# Patient Record
Sex: Male | Born: 1940 | Race: White | Hispanic: No | Smoking: Current every day smoker
Health system: Southern US, Community
[De-identification: ages and names within clinical notes are randomized; demographics above are authoritative.]

## PROBLEM LIST (undated history)

## (undated) DIAGNOSIS — R351 Nocturia: Secondary | ICD-10-CM

## (undated) DIAGNOSIS — I4891 Unspecified atrial fibrillation: Secondary | ICD-10-CM

## (undated) DIAGNOSIS — Z8719 Personal history of other diseases of the digestive system: Secondary | ICD-10-CM

## (undated) DIAGNOSIS — R319 Hematuria, unspecified: Secondary | ICD-10-CM

## (undated) DIAGNOSIS — G4733 Obstructive sleep apnea (adult) (pediatric): Secondary | ICD-10-CM

## (undated) DIAGNOSIS — Z8711 Personal history of peptic ulcer disease: Secondary | ICD-10-CM

## (undated) DIAGNOSIS — E785 Hyperlipidemia, unspecified: Secondary | ICD-10-CM

## (undated) DIAGNOSIS — M7120 Synovial cyst of popliteal space [Baker], unspecified knee: Secondary | ICD-10-CM

## (undated) DIAGNOSIS — R350 Frequency of micturition: Secondary | ICD-10-CM

## (undated) DIAGNOSIS — C679 Malignant neoplasm of bladder, unspecified: Secondary | ICD-10-CM

## (undated) DIAGNOSIS — E119 Type 2 diabetes mellitus without complications: Secondary | ICD-10-CM

## (undated) DIAGNOSIS — I10 Essential (primary) hypertension: Secondary | ICD-10-CM

## (undated) DIAGNOSIS — Z9289 Personal history of other medical treatment: Secondary | ICD-10-CM

## (undated) HISTORY — DX: Malignant neoplasm of bladder, unspecified: C67.9

## (undated) HISTORY — PX: COLONOSCOPY: SHX174

## (undated) HISTORY — DX: Essential (primary) hypertension: I10

## (undated) HISTORY — DX: Synovial cyst of popliteal space (Baker), unspecified knee: M71.20

## (undated) HISTORY — PX: REPAIR OF PERFORATED ULCER: SHX6065

## (undated) HISTORY — PX: UVULOPALATOPHARYNGOPLASTY: SHX827

## (undated) HISTORY — DX: Hyperlipidemia, unspecified: E78.5

## (undated) HISTORY — DX: Unspecified atrial fibrillation: I48.91

---
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
## 2000-03-08 ENCOUNTER — Encounter: Admission: RE | Admit: 2000-03-08 | Discharge: 2000-06-06 | Payer: Self-pay | Admitting: Endocrinology

## 2002-02-21 ENCOUNTER — Ambulatory Visit (HOSPITAL_COMMUNITY): Admission: RE | Admit: 2002-02-21 | Discharge: 2002-02-21 | Payer: Self-pay | Admitting: *Deleted

## 2004-01-19 ENCOUNTER — Encounter: Admission: RE | Admit: 2004-01-19 | Discharge: 2004-01-19 | Payer: Self-pay | Admitting: Endocrinology

## 2010-04-06 ENCOUNTER — Other Ambulatory Visit: Payer: Self-pay | Admitting: Dermatology

## 2010-05-09 ENCOUNTER — Other Ambulatory Visit: Payer: Self-pay | Admitting: Dermatology

## 2011-08-21 ENCOUNTER — Other Ambulatory Visit: Payer: Self-pay | Admitting: Endocrinology

## 2011-08-21 DIAGNOSIS — R911 Solitary pulmonary nodule: Secondary | ICD-10-CM

## 2011-08-24 ENCOUNTER — Ambulatory Visit
Admission: RE | Admit: 2011-08-24 | Discharge: 2011-08-24 | Disposition: A | Discharge status: Home / Self Care | Payer: BC Managed Care – PPO | Source: Ambulatory Visit | Attending: Endocrinology | Admitting: Endocrinology

## 2011-08-24 DIAGNOSIS — R911 Solitary pulmonary nodule: Secondary | ICD-10-CM

## 2011-12-28 ENCOUNTER — Encounter: Payer: Self-pay | Admitting: Gastroenterology

## 2012-01-25 ENCOUNTER — Other Ambulatory Visit (HOSPITAL_BASED_OUTPATIENT_CLINIC_OR_DEPARTMENT_OTHER): Payer: Self-pay | Admitting: Family Medicine

## 2012-01-25 ENCOUNTER — Ambulatory Visit (HOSPITAL_BASED_OUTPATIENT_CLINIC_OR_DEPARTMENT_OTHER)
Admission: RE | Admit: 2012-01-25 | Discharge: 2012-01-25 | Disposition: A | Discharge status: Home / Self Care | Payer: BC Managed Care – PPO | Source: Ambulatory Visit | Attending: Family Medicine | Admitting: Family Medicine

## 2012-01-25 DIAGNOSIS — M7989 Other specified soft tissue disorders: Secondary | ICD-10-CM | POA: Insufficient documentation

## 2012-01-25 DIAGNOSIS — R52 Pain, unspecified: Secondary | ICD-10-CM

## 2012-01-25 DIAGNOSIS — E119 Type 2 diabetes mellitus without complications: Secondary | ICD-10-CM | POA: Insufficient documentation

## 2012-01-25 DIAGNOSIS — Z87891 Personal history of nicotine dependence: Secondary | ICD-10-CM | POA: Insufficient documentation

## 2012-01-25 DIAGNOSIS — M79609 Pain in unspecified limb: Secondary | ICD-10-CM | POA: Insufficient documentation

## 2012-02-06 ENCOUNTER — Encounter: Payer: Self-pay | Admitting: Gastroenterology

## 2012-02-06 ENCOUNTER — Ambulatory Visit (AMBULATORY_SURGERY_CENTER): Payer: BC Managed Care – PPO | Admitting: *Deleted

## 2012-02-06 VITALS — Ht 72.0 in | Wt 253.0 lb

## 2012-02-06 DIAGNOSIS — Z1211 Encounter for screening for malignant neoplasm of colon: Secondary | ICD-10-CM

## 2012-02-06 MED ORDER — MOVIPREP 100 G PO SOLR: ORAL | Status: DC

## 2012-02-26 ENCOUNTER — Other Ambulatory Visit: Payer: BC Managed Care – PPO | Admitting: Gastroenterology

## 2012-02-28 ENCOUNTER — Telehealth: Payer: Self-pay | Admitting: Gastroenterology

## 2012-02-28 NOTE — Telephone Encounter (Signed)
Spoke with patient and informed him we would not cancel his colonoscopy appointment for 03/01/12, but to try to force plenty of liquids today as much as he possible and to avoid fiber, nuts and those foods on his list. He will  start his clear liquid diet tomorrow.He will call back if he has further questions.

## 2012-03-01 ENCOUNTER — Ambulatory Visit (AMBULATORY_SURGERY_CENTER): Payer: BC Managed Care – PPO | Admitting: Gastroenterology

## 2012-03-01 ENCOUNTER — Encounter: Payer: Self-pay | Admitting: Gastroenterology

## 2012-03-01 VITALS — BP 115/64 | HR 65 | Temp 98.1°F | Resp 19 | Ht 72.0 in | Wt 253.0 lb

## 2012-03-01 DIAGNOSIS — Z1211 Encounter for screening for malignant neoplasm of colon: Secondary | ICD-10-CM

## 2012-03-01 DIAGNOSIS — D126 Benign neoplasm of colon, unspecified: Secondary | ICD-10-CM

## 2012-03-01 DIAGNOSIS — K5730 Diverticulosis of large intestine without perforation or abscess without bleeding: Secondary | ICD-10-CM

## 2012-03-01 MED ORDER — SODIUM CHLORIDE 0.9 % IV SOLN: 500.0000 mL | INTRAVENOUS | Status: DC

## 2012-03-01 NOTE — Patient Instructions (Addendum)
YOU HAD AN ENDOSCOPIC PROCEDURE TODAY AT THE [facility]: Refer to the procedure report that was given to you for any specific questions about what was found during the examination.  If the procedure report does not answer your questions, please call your gastroenterologist to clarify.  If you requested that your care partner not be given the details of your procedure findings, then the procedure report has been included in a sealed envelope for you to review at your convenience later.  YOU SHOULD EXPECT: Some feelings of bloating in the abdomen. Passage of more gas than usual.  Walking can help get rid of the air that was put into your GI tract during the procedure and reduce the bloating. If you had a lower endoscopy (such as a colonoscopy or flexible sigmoidoscopy) you may notice spotting of blood in your stool or on the toilet paper. If you underwent a bowel prep for your procedure, then you may not have a normal bowel movement for a few days.  DIET: Your first meal following the procedure should be a light meal and then it is ok to progress to your normal diet.  A half-sandwich or bowl of soup is an example of a good first meal.  Heavy or fried foods are harder to digest and may make you feel nauseous or bloated.  Likewise meals heavy in dairy and vegetables can cause extra gas to form and this can also increase the bloating.  Drink plenty of fluids but you should avoid alcoholic beverages for 24 hours.  ACTIVITY: Your care partner should take you home directly after the procedure.  You should plan to take it easy, moving slowly for the rest of the day.  You can resume normal activity the day after the procedure however you should NOT DRIVE or use heavy machinery for 24 hours (because of the sedation medicines used during the test).    SYMPTOMS TO REPORT IMMEDIATELY: A gastroenterologist can be reached at any hour.  During normal business hours, 8:30 AM to 5:00 PM Monday through Friday,  call [phone].  After hours and on weekends, please call the GI answering service at [phone] who will take a message and have the physician on call contact you.   Following lower endoscopy (colonoscopy or flexible sigmoidoscopy):  Excessive amounts of blood in the stool  Significant tenderness or worsening of abdominal pains  Swelling of the abdomen that is new, acute  Fever of 100F or higher    FOLLOW UP: If any biopsies were taken you will be contacted by phone or by letter within the next 1-3 weeks.  Call your gastroenterologist if you have not heard about the biopsies in 3 weeks.  Our staff will call the home number listed on your records the next business day following your procedure to check on you and address any questions or concerns that you may have at that time regarding the information given to you following your procedure. This is a courtesy call and so if there is no answer at the home number and we have not heard from you through the emergency physician on call, we will assume that you have returned to your regular daily activities without incident.  SIGNATURES/CONFIDENTIALITY: You and/or your care partner have signed paperwork which will be entered into your electronic medical record.  These signatures attest to the fact that that the information above on your After Visit Summary has been reviewed and is understood.  Full responsibility of the confidentiality   of this discharge information lies with you and/or your care-partner.  Polyp, diverticulosis, and high fiber diet information given. 

## 2012-03-01 NOTE — Op Note (Signed)
[facility] [address] [place], [address]   COLONOSCOPY PROCEDURE REPORT  PATIENT: Steven Kline, Steven Kline  MR#: [id number] BIRTHDATE: [birth year] , 71  yrs. old GENDER: Male ENDOSCOPIST: Rachael Fee, MD REFERRED XB:[id number] Juleen China, M.D. PROCEDURE DATE:  03/01/2012 PROCEDURE:   Colonoscopy with biopsy ASA CLASS:   Class III INDICATIONS:average risk screening. MEDICATIONS: Fentanyl 100 mcg IV, Versed 4 mg IV, and These medications were titrated to patient response per physician's verbal order  DESCRIPTION OF PROCEDURE:   After the risks benefits and alternatives of the procedure were thoroughly explained, informed consent was obtained.  A digital rectal exam revealed no abnormalities of the rectum.   The LB CF-[id number]  endoscope was introduced through the anus and advanced to the cecum, which was identified by both the appendix and ileocecal valve. No adverse events experienced.   The quality of the prep was good, using MoviPrep  The instrument was then slowly withdrawn as the colon was fully examined.  COLON FINDINGS: One small polyp was found, removed and sent to pathology.  This was sessile, sigmoid colon, 2mm across, removed with biopsy forceps, sent to path (jar 1).  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  The examination was otherwise normal. Retroflexed views revealed no abnormalities. The time to cecum=8 minutes 15 seconds.  Withdrawal time=9 minutes 15 seconds.  The scope was withdrawn and the procedure completed. COMPLICATIONS: There were no complications.  ENDOSCOPIC IMPRESSION: One small polyp was found, removed and sent to pathology. 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 The examination was otherwise normal.  RECOMMENDATIONS: If the polyp(s) removed today are proven to be adenomatous (pre-cancerous) polyps, you will need a repeat colonoscopy in 5 years.   You will receive a letter within 1-2 weeks with the results of  your biopsy as well as final recommendations.  Please call my office if you have not received a letter after 3 weeks.   eSigned:  Rachael Fee, MD 03/01/2012 9:55 AM

## 2012-03-01 NOTE — Progress Notes (Signed)
Patient did not experience any of the following events: a burn prior to discharge; a fall within the facility; wrong site/side/patient/procedure/implant event; or a hospital transfer or hospital admission upon discharge from the facility. (G8907) Patient did not have preoperative order for IV antibiotic SSI prophylaxis. (G8918)  

## 2012-03-04 ENCOUNTER — Telehealth: Payer: Self-pay | Admitting: *Deleted

## 2012-03-04 NOTE — Telephone Encounter (Signed)
Left message that we called for f/u 

## 2012-03-07 ENCOUNTER — Encounter: Payer: Self-pay | Admitting: Gastroenterology

## 2015-10-03 ENCOUNTER — Emergency Department (HOSPITAL_COMMUNITY)
Admission: EM | Admit: 2015-10-03 | Discharge: 2015-10-03 | Disposition: A | Discharge status: Home / Self Care | Payer: 59 | Attending: Emergency Medicine | Admitting: Emergency Medicine

## 2015-10-03 ENCOUNTER — Encounter (HOSPITAL_COMMUNITY): Payer: Self-pay | Admitting: Emergency Medicine

## 2015-10-03 ENCOUNTER — Emergency Department (HOSPITAL_COMMUNITY): Payer: 59

## 2015-10-03 DIAGNOSIS — X58XXXA Exposure to other specified factors, initial encounter: Secondary | ICD-10-CM | POA: Insufficient documentation

## 2015-10-03 DIAGNOSIS — Y929 Unspecified place or not applicable: Secondary | ICD-10-CM | POA: Insufficient documentation

## 2015-10-03 DIAGNOSIS — Z7984 Long term (current) use of oral hypoglycemic drugs: Secondary | ICD-10-CM | POA: Insufficient documentation

## 2015-10-03 DIAGNOSIS — E119 Type 2 diabetes mellitus without complications: Secondary | ICD-10-CM | POA: Insufficient documentation

## 2015-10-03 DIAGNOSIS — I10 Essential (primary) hypertension: Secondary | ICD-10-CM | POA: Insufficient documentation

## 2015-10-03 DIAGNOSIS — Z79899 Other long term (current) drug therapy: Secondary | ICD-10-CM | POA: Diagnosis not present

## 2015-10-03 DIAGNOSIS — M9689 Other intraoperative and postprocedural complications and disorders of the musculoskeletal system: Secondary | ICD-10-CM | POA: Insufficient documentation

## 2015-10-03 DIAGNOSIS — E785 Hyperlipidemia, unspecified: Secondary | ICD-10-CM | POA: Insufficient documentation

## 2015-10-03 DIAGNOSIS — Y999 Unspecified external cause status: Secondary | ICD-10-CM | POA: Diagnosis not present

## 2015-10-03 DIAGNOSIS — S31109A Unspecified open wound of abdominal wall, unspecified quadrant without penetration into peritoneal cavity, initial encounter: Secondary | ICD-10-CM | POA: Diagnosis present

## 2015-10-03 DIAGNOSIS — Y939 Activity, unspecified: Secondary | ICD-10-CM | POA: Diagnosis not present

## 2015-10-03 DIAGNOSIS — F17210 Nicotine dependence, cigarettes, uncomplicated: Secondary | ICD-10-CM | POA: Insufficient documentation

## 2015-10-03 LAB — CBC WITH DIFFERENTIAL/PLATELET
Basophils Absolute: 0 10*3/uL (ref 0.0–0.1)
Basophils Relative: 0 %
EOS ABS: 0.3 10*3/uL (ref 0.0–0.7)
EOS PCT: 4 %
HCT: 32.3 % — ABNORMAL LOW (ref 39.0–52.0)
Hemoglobin: 9.7 g/dL — ABNORMAL LOW (ref 13.0–17.0)
LYMPHS ABS: 1.6 10*3/uL (ref 0.7–4.0)
Lymphocytes Relative: 17 %
MCH: 24.7 pg — AB (ref 26.0–34.0)
MCHC: 30 g/dL (ref 30.0–36.0)
MCV: 82.4 fL (ref 78.0–100.0)
MONO ABS: 0.8 10*3/uL (ref 0.1–1.0)
Monocytes Relative: 8 %
Neutro Abs: 6.8 10*3/uL (ref 1.7–7.7)
Neutrophils Relative %: 71 %
PLATELETS: 326 10*3/uL (ref 150–400)
RBC: 3.92 MIL/uL — AB (ref 4.22–5.81)
RDW: 18.5 % — AB (ref 11.5–15.5)
WBC: 9.4 10*3/uL (ref 4.0–10.5)

## 2015-10-03 LAB — BASIC METABOLIC PANEL
Anion gap: 6 (ref 5–15)
BUN: 17 mg/dL (ref 6–20)
CO2: 23 mmol/L (ref 22–32)
Calcium: 8.5 mg/dL — ABNORMAL LOW (ref 8.9–10.3)
Chloride: 109 mmol/L (ref 101–111)
Creatinine, Ser: 1.57 mg/dL — ABNORMAL HIGH (ref 0.61–1.24)
GFR calc Af Amer: 48 mL/min — ABNORMAL LOW (ref >60)
GFR calc non Af Amer: 41 mL/min — ABNORMAL LOW (ref >60)
Glucose, Bld: 108 mg/dL — ABNORMAL HIGH (ref 65–99)
Potassium: 4.8 mmol/L (ref 3.5–5.1)
Sodium: 138 mmol/L (ref 135–145)

## 2015-10-03 MED ORDER — SODIUM CHLORIDE 0.9 % IV BOLUS (SEPSIS)
1000.0000 mL | Freq: Once | INTRAVENOUS | Status: AC
  Administered 2015-10-03: 1000 mL via INTRAVENOUS

## 2015-10-03 MED ORDER — IOPAMIDOL (ISOVUE-300) INJECTION 61%
100.0000 mL | Freq: Once | INTRAVENOUS | Status: AC | PRN
  Administered 2015-10-03: 80 mL via INTRAVENOUS

## 2015-10-03 NOTE — ED Notes (Signed)
Applied a wet to dry dressing consisting of Xeroform and 4x4 dressings.

## 2015-10-03 NOTE — ED Provider Notes (Signed)
[facility] DEPT Provider Note   CSN: [id number] Arrival date & time: 10/03/15  [id number]  First Provider Contact:  First MD Initiated Contact with Patient 10/03/15 1939        History   Chief Complaint Chief Complaint  Patient presents with  . Post-op Problem    HPI Steven Kline is a 75 y.o. male presents with his daughter for evaluation of his surgical wound. On July 4 he had surgery at an outside hospital for a perforated gastric ulcer. Since then, patient has been put on antibiotics by his PCP for redness surrounding his upper abdominal wound. His daughter who is an ICU nurse removed his staples a few days ago. For several days since the surgery he has had serous sinus drainage out of a small hole at the bottom of his wound. However over the last couple days it has turned into a pustular drainage. No abdominal pain. No fevers or vomiting. Patient overall feels well except that his wound keeps draining. Is scheduled to follow-up with Dr. Barry Dienes and about 4 days.  HPI  Past Medical History:  Diagnosis Date  . Baker's cyst of knee   . Diabetes ([place])   . Gastric ulcer   . Hyperlipidemia   . Hypertension   . Sleep apnea    cpap    There are no active problems to display for this patient.   Past Surgical History:  Procedure Laterality Date  . [place]   left  . REPAIR OF PERFORATED ULCER    . uvulaplasty         Home Medications    Prior to Admission medications   Medication Sig Start Date End Date Taking? Authorizing Provider  glyBURIDE-metformin (GLUCOVANCE) 2.5-500 MG per tablet Take 2 tablets by mouth 2 (two) times daily with a meal. Takes 1 tablet in am and 2 tablets at bedtime   Yes Historical Provider, MD  lisinopril (PRINIVIL,ZESTRIL) 20 MG tablet Take 20 mg by mouth daily.    Yes Historical Provider, MD  ONGLYZA 5 MG TABS tablet Take 5 mg by mouth daily.    Yes Historical Provider, MD  pravastatin (PRAVACHOL) 80 MG tablet Take 80 mg by  mouth at bedtime.    Yes Historical Provider, MD  ranitidine (ZANTAC) 75 MG tablet Take 75 mg by mouth 2 (two) times daily.   Yes Historical Provider, MD  sulfamethoxazole-trimethoprim (BACTRIM DS,SEPTRA DS) 800-160 MG tablet Take 1 tablet by mouth 2 (two) times daily. 09/28/15 10/08/15 Yes Historical Provider, MD    Family History Family History  Problem Relation Age of Onset  . Colon cancer Neg Hx   . Stomach cancer Neg Hx     Social History Social History  Substance Use Topics  . Smoking status: Current Every Day Smoker    Types: Cigars  . Smokeless tobacco: Never Used     Comment: smokes 6 cigars a day  . Alcohol use No     Allergies   Review of patient's allergies indicates no known allergies.   Review of Systems Review of Systems  Constitutional: Negative for fever.  Gastrointestinal: Negative for abdominal pain and vomiting.  Skin: Positive for wound. Negative for color change.  All other systems reviewed and are negative.    Physical Exam Updated Vital Signs BP 131/67 (BP Location: Left Arm)   Pulse 64   Temp 98.5 F (36.9 C) (Oral)   Resp 18   Ht 6' (1.829 m)   Wt 220 lb (  99.8 kg)   SpO2 100%   BMI 29.84 kg/m   Physical Exam  Constitutional: He is oriented to person, place, and time. He appears well-developed and well-nourished.  HENT:  Head: Normocephalic and atraumatic.  Right Ear: External ear normal.  Left Ear: External ear normal.  Nose: Nose normal.  Eyes: Right eye exhibits no discharge. Left eye exhibits no discharge.  Neck: Neck supple.  Cardiovascular: Normal rate, regular rhythm, normal heart sounds and intact distal pulses.   Pulmonary/Chest: Effort normal and breath sounds normal.  Abdominal: Soft. He exhibits no distension. There is no tenderness.  Midline surgical wound. No warmth or erythema. Towards inferior aspect there is a small hole with no active drainage  Musculoskeletal: He exhibits no edema.  Neurological: He is alert and  oriented to person, place, and time.  Skin: Skin is warm and dry. No erythema.  Nursing note and vitals reviewed.    ED Treatments / Results  Labs (all labs ordered are listed, but only abnormal results are displayed) Labs Reviewed  BASIC METABOLIC PANEL - Abnormal; Notable for the following:       Result Value   Glucose, Bld 108 (*)    Creatinine, Ser 1.57 (*)    Calcium 8.5 (*)    GFR calc non Af Amer 41 (*)    GFR calc Af Amer 48 (*)    All other components within normal limits  CBC WITH DIFFERENTIAL/PLATELET - Abnormal; Notable for the following:    RBC 3.92 (*)    Hemoglobin 9.7 (*)    HCT 32.3 (*)    MCH 24.7 (*)    RDW 18.5 (*)    All other components within normal limits    EKG  EKG Interpretation None       Radiology Ct Abdomen Pelvis W Contrast  Result Date: 10/03/2015 CLINICAL DATA:  Patient with recent gastric surgery for perforated ulcer. Infected incision. EXAM: CT ABDOMEN AND PELVIS WITH CONTRAST TECHNIQUE: Multidetector CT imaging of the abdomen and pelvis was performed using the standard protocol following bolus administration of intravenous contrast. CONTRAST:  65mL ISOVUE-300 IOPAMIDOL (ISOVUE-300) INJECTION 61% COMPARISON:  None. FINDINGS: Lower chest: Normal heart size. Dependent atelectasis within the bilateral lower lobes. No pleural effusion. Hepatobiliary: Liver is normal in size and contour. No focal lesion identified. Gallbladder is unremarkable. Pancreas: Unremarkable Spleen: Unremarkable Adrenals/Urinary Tract: Normal adrenal glands. Kidneys enhance symmetrically with contrast. No hydronephrosis. Urinary bladder is unremarkable. Too small to characterize low-attenuation lesions within the right and left kidney. Stomach/Bowel: Sigmoid colonic diverticulosis. No CT evidence for acute diverticulitis. Normal appendix. Postsurgical changes along the greater curvature of the stomach compatible with recent surgical repair perforated gastric ulcer. There is  stranding within the surrounding fat. Additionally there is focal circumferential wall thickening of the adjacent transverse colon (image 35; series 2). There appears to be an identifiable fat plan located between the colon and the overlying midline surgical wound. Vascular/Lymphatic: Peripheral calcified atherosclerotic plaque. Normal caliber abdominal aorta. No retroperitoneal adenopathy. Other: Small fat containing right inguinal hernia.Central dystrophic calcifications in the prostate. Musculoskeletal: Ventral abdominal wall incision containing small amount of fluid and gas. There is surrounding fat stranding. No intra-abdominal extension or intra-abdominal abscess identified. IMPRESSION: Postsurgical changes along the greater curvature of the stomach compatible with surgical repair of a perforated gastric ulcer. There is a small amount of stranding within the surrounding fat. There is a ventral midline incision containing fluid and gas concerning for superimposed infection. There appears to be a definable  fat plane located between the ventral abdominal wall incision and the underlying bowel however clinical correlation is recommended to exclude the possibility of enteric cutaneous fistula. There is circumferential wall thickening of a portion of the distal transverse colon which may be reactive in etiology. This may be infectious or inflammatory or reactive from prior surgery. However given the appearance malignancy cannot be excluded at this location. If not previously performed, recommend correlation with colonoscopy in the non acute setting. Aortic atherosclerosis. Electronically Signed   By: Lovey Newcomer M.D.   On: 10/03/2015 21:48   Procedures Procedures (including critical care time)  Medications Ordered in ED Medications  sodium chloride 0.9 % bolus 1,000 mL (0 mLs Intravenous Stopped 10/03/15 2203)  iopamidol (ISOVUE-300) 61 % injection 100 mL (80 mLs Intravenous Contrast Given 10/03/15 2118)      Initial Impression / Assessment and Plan / ED Course  I have reviewed the triage vital signs and the nursing notes.  Pertinent labs & imaging results that were available during my care of the patient were reviewed by me and considered in my medical decision making (see chart for details).  Clinical Course  Comment By Time  Overall well appearing, denies pain. Will get CT to eval for possible intra-abdominal abscess given change in drainage color Sherwood Gambler, MD 07/23 1939  Discussed CT findings with Dr. Marlou Starks who is in [place]. Can't definitively say there is a fistula without oral contrast. Recommends no change in antibiotics (is on bactrim). He is overall well appearing, no cellulitis, no pain or fevers. WBC normal. Discussed his increased Cr with daughter and patient, increase fluids and f/u with PCP. Sherwood Gambler, MD 07/23 2329      Final Clinical Impressions(s) / ED Diagnoses   Final diagnoses:  Open abdominal wall wound, initial encounter    New Prescriptions Discharge Medication List as of 10/03/2015 11:28 PM       Sherwood Gambler, MD 10/04/15 [phone]

## 2015-10-03 NOTE — ED Notes (Signed)
Patient reports having abd surgery on 09/14/2015 in [place]. Patient with midline incision, at the most distal point of the incision there is approx a 26mm round open area with purulent drainage. Family at bedside states the color of the drainage has changed to this over the past couple days. Patient denies pain. Patient is A&Ox4, NAD noted. Patient has packet of paperwork from hospital, this was given to MD for review.

## 2015-10-03 NOTE — ED Triage Notes (Signed)
Patient reports having gastric surgery for a perforating stomach ulcer on July 4th. Now over the last 4-5 days, patient's incision is reddened and per family "pouring fluid when not covered."

## 2016-02-16 ENCOUNTER — Emergency Department (HOSPITAL_COMMUNITY): Payer: 59

## 2016-02-16 ENCOUNTER — Encounter (HOSPITAL_COMMUNITY): Payer: Self-pay | Admitting: Emergency Medicine

## 2016-02-16 ENCOUNTER — Emergency Department (HOSPITAL_COMMUNITY)
Admission: EM | Admit: 2016-02-16 | Discharge: 2016-02-16 | Disposition: A | Discharge status: Home / Self Care | Payer: 59 | Attending: Emergency Medicine | Admitting: Emergency Medicine

## 2016-02-16 DIAGNOSIS — R1010 Upper abdominal pain, unspecified: Secondary | ICD-10-CM | POA: Diagnosis not present

## 2016-02-16 DIAGNOSIS — Z79899 Other long term (current) drug therapy: Secondary | ICD-10-CM | POA: Insufficient documentation

## 2016-02-16 DIAGNOSIS — E119 Type 2 diabetes mellitus without complications: Secondary | ICD-10-CM | POA: Insufficient documentation

## 2016-02-16 DIAGNOSIS — F17290 Nicotine dependence, other tobacco product, uncomplicated: Secondary | ICD-10-CM | POA: Diagnosis not present

## 2016-02-16 DIAGNOSIS — Z7984 Long term (current) use of oral hypoglycemic drugs: Secondary | ICD-10-CM | POA: Diagnosis not present

## 2016-02-16 DIAGNOSIS — R1013 Epigastric pain: Secondary | ICD-10-CM | POA: Diagnosis present

## 2016-02-16 DIAGNOSIS — I10 Essential (primary) hypertension: Secondary | ICD-10-CM | POA: Diagnosis not present

## 2016-02-16 LAB — I-STAT CHEM 8, ED
BUN: 22 mg/dL — ABNORMAL HIGH (ref 6–20)
Calcium, Ion: 1.16 mmol/L (ref 1.15–1.40)
Chloride: 107 mmol/L (ref 101–111)
Creatinine, Ser: 1.1 mg/dL (ref 0.61–1.24)
Glucose, Bld: 115 mg/dL — ABNORMAL HIGH (ref 65–99)
HEMATOCRIT: 40 % (ref 39.0–52.0)
HEMOGLOBIN: 13.6 g/dL (ref 13.0–17.0)
Potassium: 4.4 mmol/L (ref 3.5–5.1)
SODIUM: 143 mmol/L (ref 135–145)
TCO2: 21 mmol/L (ref 0–100)

## 2016-02-16 MED ORDER — IOPAMIDOL (ISOVUE-300) INJECTION 61%
100.0000 mL | Freq: Once | INTRAVENOUS | Status: AC | PRN
  Administered 2016-02-16: 100 mL via INTRAVENOUS

## 2016-02-16 NOTE — ED Triage Notes (Signed)
Per pt, states he had abdominal surgery a few months ago-states 2 days ago he felt something pop in his incision-states he feels discomfort when he coughs-states he thinks he might have a hernia-incision healed and intact

## 2016-02-16 NOTE — ED Notes (Signed)
Bed: [facility]PT4 Expected date:  Expected time:  Means of arrival:  Comments: 

## 2016-02-16 NOTE — ED Provider Notes (Signed)
[facility] DEPT Provider Note   CSN: [id number] Arrival date & time: 02/16/16  1110     History   Chief Complaint Chief Complaint  Patient presents with  . Abdominal Pain    HPI SOMA ERICHSEN is a 75 y.o. male.  Patient states that he had surgery on his abdomen months ago. And he has noticed a bulging to his upper abdomen every once a while mild discomfort   The history is provided by the patient. No language interpreter was used.  Abdominal Pain   This is a new problem. The current episode started more than 2 days ago. The problem occurs rarely. The problem has been resolved. The pain is associated with an unknown factor. The pain is located in the epigastric region. The quality of the pain is aching. Pertinent negatives include diarrhea, frequency, hematuria and headaches.    Past Medical History:  Diagnosis Date  . Baker's cyst of knee   . Diabetes ([place])   . Gastric ulcer   . Hyperlipidemia   . Hypertension   . Sleep apnea    cpap    There are no active problems to display for this patient.   Past Surgical History:  Procedure Laterality Date  . COLON SURGERY    . [place]   left  . REPAIR OF PERFORATED ULCER    . uvulaplasty         Home Medications    Prior to Admission medications   Medication Sig Start Date End Date Taking? Authorizing Provider  glyBURIDE-metformin (GLUCOVANCE) 2.5-500 MG per tablet Take 2 tablets by mouth 2 (two) times daily with a meal. Takes 1 tablet in am and 2 tablets at bedtime    Historical Provider, MD  lisinopril (PRINIVIL,ZESTRIL) 20 MG tablet Take 20 mg by mouth daily.     Historical Provider, MD  ONGLYZA 5 MG TABS tablet Take 5 mg by mouth daily.     Historical Provider, MD  pravastatin (PRAVACHOL) 80 MG tablet Take 80 mg by mouth at bedtime.     Historical Provider, MD  ranitidine (ZANTAC) 75 MG tablet Take 75 mg by mouth 2 (two) times daily.    Historical Provider, MD    Family History Family  History  Problem Relation Age of Onset  . Colon cancer Neg Hx   . Stomach cancer Neg Hx     Social History Social History  Substance Use Topics  . Smoking status: Current Every Day Smoker    Types: Cigars  . Smokeless tobacco: Never Used     Comment: smokes 6 cigars a day  . Alcohol use No     Allergies   Patient has no known allergies.   Review of Systems Review of Systems  Constitutional: Negative for appetite change and fatigue.  HENT: Negative for congestion, ear discharge and sinus pressure.   Eyes: Negative for discharge.  Respiratory: Negative for cough.   Cardiovascular: Negative for chest pain.  Gastrointestinal: Positive for abdominal pain. Negative for diarrhea.  Genitourinary: Negative for frequency and hematuria.  Musculoskeletal: Negative for back pain.  Skin: Negative for rash.  Neurological: Negative for seizures and headaches.  Psychiatric/Behavioral: Negative for hallucinations.     Physical Exam Updated Vital Signs BP 153/73 (BP Location: Left Arm)   Pulse 83   Temp 97.6 F (36.4 C) (Oral)   Resp 16   Ht 6' (1.829 m)   Wt 235 lb (106.6 kg)   SpO2 100%   BMI 31.87  kg/m   Physical Exam  Constitutional: He is oriented to person, place, and time. He appears well-developed.  HENT:  Head: Normocephalic.  Eyes: Conjunctivae and EOM are normal. No scleral icterus.  Neck: Neck supple. No thyromegaly present.  Cardiovascular: Normal rate and regular rhythm.  Exam reveals no gallop and no friction rub.   No murmur heard. Pulmonary/Chest: No stridor. He has no wheezes. He has no rales. He exhibits no tenderness.  Abdominal: He exhibits no distension. There is no tenderness. There is no rebound.  He scored to abdomen  Musculoskeletal: Normal range of motion. He exhibits no edema.  Lymphadenopathy:    He has no cervical adenopathy.  Neurological: He is oriented to person, place, and time. He exhibits normal muscle tone. Coordination normal.  Skin:  No rash noted. No erythema.  Psychiatric: He has a normal mood and affect. His behavior is normal.     ED Treatments / Results  Labs (all labs ordered are listed, but only abnormal results are displayed) Labs Reviewed  I-STAT CHEM 8, ED - Abnormal; Notable for the following:       Result Value   BUN 22 (*)    Glucose, Bld 115 (*)    All other components within normal limits    EKG  EKG Interpretation None       Radiology Ct Abdomen Pelvis W Contrast  Result Date: 02/16/2016 CLINICAL DATA:  Abdominal surgery few months ago. Felt something pop. EXAM: CT ABDOMEN AND PELVIS WITH CONTRAST TECHNIQUE: Multidetector CT imaging of the abdomen and pelvis was performed using the standard protocol following bolus administration of intravenous contrast. CONTRAST:  172mL ISOVUE-300 IOPAMIDOL (ISOVUE-300) INJECTION 61% COMPARISON:  CT abdomen 10/03/2014, CT chest 08/24/2011, CT abdomen 01/19/2004 FINDINGS: Lower chest: 12 mm nodular area at the right lung base which may reflect area of scarring versus bronchiectasis versus a small nodule. Hepatobiliary: No focal liver abnormality is seen. No gallstones, gallbladder wall thickening, or biliary dilatation. Pancreas: Unremarkable. No pancreatic ductal dilatation or surrounding inflammatory changes. Spleen: Normal in size without focal abnormality. Adrenals/Urinary Tract: Adrenal glands are unremarkable. No urolithiasis or obstructive uropathy. 12 mm hypodense, fluid attenuating right renal mass most consistent with a cyst. Bladder is unremarkable. Stomach/Bowel: Stomach is within normal limits. Appendix appears normal. No evidence of bowel wall thickening, distention, or inflammatory changes. Vascular/Lymphatic: Abdominal aortic atherosclerosis. No lymphadenopathy. Reproductive: Prostate is unremarkable. Other: No abdominal wall hernia. Mild rectus diastases. No abdominopelvic ascites. Musculoskeletal: No acute osseous abnormality. No lytic or sclerotic  osseous lesion. Mild degenerative changes of bilateral sacroiliac joints. 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 IMPRESSION: 1. No acute abdominal or pelvic pathology. 2. 12 mm nodular area at the right lung base which may reflect area of scarring versus bronchiectasis versus a small nodule. Recommend follow-up CT chest in 3 months. 3. No abdominal wall hernia.  Mild rectus diastases. Electronically Signed   By: Kathreen Devoid   On: 02/16/2016 14:08    Procedures Procedures (including critical care time)  Medications Ordered in ED Medications  iopamidol (ISOVUE-300) 61 % injection 100 mL (100 mLs Intravenous Contrast Given 02/16/16 1338)     Initial Impression / Assessment and Plan / ED Course  I have reviewed the triage vital signs and the nursing notes.  Pertinent labs & imaging results that were available during my care of the patient were reviewed by me and considered in my medical  decision making (see chart for details).  Clinical Course     CT scan of abdomen unremarkable. Although a small nodule was seen in his lung. Suspect the bulging is abdomen is from muscle relaxation. Patient will be followed up with the surgeon and was told to have his primary care doctor repeat a CT of his chest in 3 months  Final Clinical Impressions(s) / ED Diagnoses   Final diagnoses:  Pain of upper abdomen    New Prescriptions New Prescriptions   No medications on file     Milton Ferguson, MD 02/16/16 1436

## 2016-02-16 NOTE — Discharge Instructions (Signed)
Follow-up with your general surgeon if you have any more problems with your abdomen. Your CAT scan showed a very small nodule on your lung. Most likely it is just scarring. The radiologist recommends a CAT scan of your chest in 3 months to reevaluate the small nodule

## 2016-04-05 DIAGNOSIS — E118 Type 2 diabetes mellitus with unspecified complications: Secondary | ICD-10-CM | POA: Diagnosis not present

## 2016-04-05 DIAGNOSIS — I10 Essential (primary) hypertension: Secondary | ICD-10-CM | POA: Diagnosis not present

## 2016-04-05 DIAGNOSIS — E789 Disorder of lipoprotein metabolism, unspecified: Secondary | ICD-10-CM | POA: Diagnosis not present

## 2016-04-12 DIAGNOSIS — E118 Type 2 diabetes mellitus with unspecified complications: Secondary | ICD-10-CM | POA: Diagnosis not present

## 2016-07-05 DIAGNOSIS — M6208 Separation of muscle (nontraumatic), other site: Secondary | ICD-10-CM | POA: Diagnosis not present

## 2016-08-11 DIAGNOSIS — E118 Type 2 diabetes mellitus with unspecified complications: Secondary | ICD-10-CM | POA: Diagnosis not present

## 2016-08-11 DIAGNOSIS — N390 Urinary tract infection, site not specified: Secondary | ICD-10-CM | POA: Diagnosis not present

## 2016-08-11 DIAGNOSIS — E789 Disorder of lipoprotein metabolism, unspecified: Secondary | ICD-10-CM | POA: Diagnosis not present

## 2016-08-15 DIAGNOSIS — R300 Dysuria: Secondary | ICD-10-CM | POA: Diagnosis not present

## 2016-08-15 DIAGNOSIS — N390 Urinary tract infection, site not specified: Secondary | ICD-10-CM | POA: Diagnosis not present

## 2016-08-21 DIAGNOSIS — D414 Neoplasm of uncertain behavior of bladder: Secondary | ICD-10-CM | POA: Diagnosis not present

## 2016-08-21 DIAGNOSIS — R310 Gross hematuria: Secondary | ICD-10-CM | POA: Diagnosis not present

## 2016-08-21 DIAGNOSIS — N400 Enlarged prostate without lower urinary tract symptoms: Secondary | ICD-10-CM | POA: Diagnosis not present

## 2016-08-30 DIAGNOSIS — R310 Gross hematuria: Secondary | ICD-10-CM | POA: Diagnosis not present

## 2016-09-05 DIAGNOSIS — N390 Urinary tract infection, site not specified: Secondary | ICD-10-CM | POA: Diagnosis not present

## 2016-09-05 DIAGNOSIS — E789 Disorder of lipoprotein metabolism, unspecified: Secondary | ICD-10-CM | POA: Diagnosis not present

## 2016-09-05 DIAGNOSIS — Z125 Encounter for screening for malignant neoplasm of prostate: Secondary | ICD-10-CM | POA: Diagnosis not present

## 2016-09-05 DIAGNOSIS — E118 Type 2 diabetes mellitus with unspecified complications: Secondary | ICD-10-CM | POA: Diagnosis not present

## 2016-09-05 DIAGNOSIS — R319 Hematuria, unspecified: Secondary | ICD-10-CM | POA: Diagnosis not present

## 2016-09-05 DIAGNOSIS — I10 Essential (primary) hypertension: Secondary | ICD-10-CM | POA: Diagnosis not present

## 2016-09-18 ENCOUNTER — Other Ambulatory Visit: Payer: Self-pay | Admitting: Urology

## 2016-09-18 DIAGNOSIS — D414 Neoplasm of uncertain behavior of bladder: Secondary | ICD-10-CM | POA: Diagnosis not present

## 2016-09-18 DIAGNOSIS — R310 Gross hematuria: Secondary | ICD-10-CM | POA: Diagnosis not present

## 2016-09-20 DIAGNOSIS — I10 Essential (primary) hypertension: Secondary | ICD-10-CM | POA: Diagnosis not present

## 2016-09-20 DIAGNOSIS — E669 Obesity, unspecified: Secondary | ICD-10-CM | POA: Diagnosis not present

## 2016-09-20 DIAGNOSIS — E119 Type 2 diabetes mellitus without complications: Secondary | ICD-10-CM | POA: Diagnosis not present

## 2016-09-20 DIAGNOSIS — D414 Neoplasm of uncertain behavior of bladder: Secondary | ICD-10-CM | POA: Diagnosis not present

## 2016-09-27 DIAGNOSIS — Z0189 Encounter for other specified special examinations: Secondary | ICD-10-CM | POA: Diagnosis not present

## 2016-09-27 DIAGNOSIS — I10 Essential (primary) hypertension: Secondary | ICD-10-CM | POA: Diagnosis not present

## 2016-09-27 DIAGNOSIS — Z01818 Encounter for other preprocedural examination: Secondary | ICD-10-CM | POA: Diagnosis not present

## 2016-09-27 DIAGNOSIS — R9431 Abnormal electrocardiogram [ECG] [EKG]: Secondary | ICD-10-CM | POA: Diagnosis not present

## 2016-09-29 DIAGNOSIS — R9431 Abnormal electrocardiogram [ECG] [EKG]: Secondary | ICD-10-CM | POA: Diagnosis not present

## 2016-10-26 ENCOUNTER — Encounter (HOSPITAL_BASED_OUTPATIENT_CLINIC_OR_DEPARTMENT_OTHER): Payer: Self-pay | Admitting: *Deleted

## 2016-10-26 NOTE — Progress Notes (Signed)
NPO AFTER MN.  ARRIVE AT 0600.  NEEDS ISTAT 8 AND EKG.

## 2016-11-01 DIAGNOSIS — R9431 Abnormal electrocardiogram [ECG] [EKG]: Secondary | ICD-10-CM | POA: Diagnosis not present

## 2016-11-05 NOTE — Anesthesia Preprocedure Evaluation (Addendum)
Anesthesia Evaluation  Patient identified by MRN, date of birth, ID band Patient awake    Reviewed: Allergy & Precautions, NPO status , Patient's Chart, lab work & pertinent test results  Airway Mallampati: III  TM Distance: >3 FB     Dental   Pulmonary sleep apnea , Current Smoker,    breath sounds clear to auscultation       Cardiovascular hypertension, Pt. on medications  Rhythm:Regular Rate:Normal  Daughters report pt had stress testing and echo with Dr. Einar Gip earlier this summer that were reportedly normal.    Neuro/Psych negative neurological ROS     GI/Hepatic negative GI ROS, Neg liver ROS,   Endo/Other  diabetes, Type 2, Oral Hypoglycemic Agents  Renal/GU negative Renal ROS     Musculoskeletal   Abdominal   Peds  Hematology negative hematology ROS (+)   Anesthesia Other Findings   Reproductive/Obstetrics                            Anesthesia Physical Anesthesia Plan  ASA: II  Anesthesia Plan: General   Post-op Pain Management:    Induction: Intravenous  PONV Risk Score and Plan: 2 and Ondansetron, Dexamethasone and Treatment may vary due to age or medical condition  Airway Management Planned: LMA  Additional Equipment:   Intra-op Plan:   Post-operative Plan: Extubation in OR  Informed Consent: I have reviewed the patients History and Physical, chart, labs and discussed the procedure including the risks, benefits and alternatives for the proposed anesthesia with the patient or authorized representative who has indicated his/her understanding and acceptance.   Dental advisory given  Plan Discussed with: CRNA  Anesthesia Plan Comments:        Anesthesia Quick Evaluation

## 2016-11-06 ENCOUNTER — Ambulatory Visit (HOSPITAL_BASED_OUTPATIENT_CLINIC_OR_DEPARTMENT_OTHER)
Admission: RE | Admit: 2016-11-06 | Discharge: 2016-11-06 | Disposition: A | Discharge status: Home / Self Care | Payer: Medicare Other | Source: Ambulatory Visit | Attending: Urology | Admitting: Urology

## 2016-11-06 ENCOUNTER — Ambulatory Visit (HOSPITAL_BASED_OUTPATIENT_CLINIC_OR_DEPARTMENT_OTHER): Payer: Medicare Other | Admitting: Anesthesiology

## 2016-11-06 ENCOUNTER — Encounter (HOSPITAL_BASED_OUTPATIENT_CLINIC_OR_DEPARTMENT_OTHER): Payer: Self-pay | Admitting: *Deleted

## 2016-11-06 ENCOUNTER — Encounter (HOSPITAL_BASED_OUTPATIENT_CLINIC_OR_DEPARTMENT_OTHER)
Admission: RE | Disposition: A | Discharge status: Home / Self Care | Payer: Self-pay | Source: Ambulatory Visit | Attending: Urology

## 2016-11-06 DIAGNOSIS — E119 Type 2 diabetes mellitus without complications: Secondary | ICD-10-CM | POA: Diagnosis not present

## 2016-11-06 DIAGNOSIS — Z7984 Long term (current) use of oral hypoglycemic drugs: Secondary | ICD-10-CM | POA: Insufficient documentation

## 2016-11-06 DIAGNOSIS — R310 Gross hematuria: Secondary | ICD-10-CM | POA: Insufficient documentation

## 2016-11-06 DIAGNOSIS — D494 Neoplasm of unspecified behavior of bladder: Secondary | ICD-10-CM

## 2016-11-06 DIAGNOSIS — N359 Urethral stricture, unspecified: Secondary | ICD-10-CM | POA: Insufficient documentation

## 2016-11-06 DIAGNOSIS — F17210 Nicotine dependence, cigarettes, uncomplicated: Secondary | ICD-10-CM | POA: Insufficient documentation

## 2016-11-06 DIAGNOSIS — I10 Essential (primary) hypertension: Secondary | ICD-10-CM | POA: Diagnosis not present

## 2016-11-06 DIAGNOSIS — C675 Malignant neoplasm of bladder neck: Secondary | ICD-10-CM | POA: Insufficient documentation

## 2016-11-06 DIAGNOSIS — C679 Malignant neoplasm of bladder, unspecified: Secondary | ICD-10-CM | POA: Diagnosis not present

## 2016-11-06 DIAGNOSIS — Z79899 Other long term (current) drug therapy: Secondary | ICD-10-CM | POA: Insufficient documentation

## 2016-11-06 HISTORY — DX: Personal history of other medical treatment: Z92.89

## 2016-11-06 HISTORY — PX: TRANSURETHRAL RESECTION OF BLADDER TUMOR: SHX2575

## 2016-11-06 HISTORY — DX: Personal history of peptic ulcer disease: Z87.11

## 2016-11-06 HISTORY — DX: Personal history of other diseases of the digestive system: Z87.19

## 2016-11-06 HISTORY — DX: Frequency of micturition: R35.0

## 2016-11-06 HISTORY — DX: Nocturia: R35.1

## 2016-11-06 HISTORY — DX: Type 2 diabetes mellitus without complications: E11.9

## 2016-11-06 HISTORY — DX: Hematuria, unspecified: R31.9

## 2016-11-06 HISTORY — DX: Obstructive sleep apnea (adult) (pediatric): G47.33

## 2016-11-06 LAB — POCT I-STAT, CHEM 8
BUN: 20 mg/dL (ref 6–20)
CALCIUM ION: 1.2 mmol/L (ref 1.15–1.40)
CREATININE: 1.2 mg/dL (ref 0.61–1.24)
Chloride: 108 mmol/L (ref 101–111)
GLUCOSE: 100 mg/dL — AB (ref 65–99)
HCT: 38 % — ABNORMAL LOW (ref 39.0–52.0)
HEMOGLOBIN: 12.9 g/dL — AB (ref 13.0–17.0)
POTASSIUM: 4 mmol/L (ref 3.5–5.1)
Sodium: 143 mmol/L (ref 135–145)
TCO2: 22 mmol/L (ref 22–32)

## 2016-11-06 LAB — GLUCOSE, CAPILLARY: Glucose-Capillary: 100 mg/dL — ABNORMAL HIGH (ref 65–99)

## 2016-11-06 SURGERY — TURBT (TRANSURETHRAL RESECTION OF BLADDER TUMOR)
Anesthesia: General | Site: Bladder

## 2016-11-06 MED ORDER — ONDANSETRON HCL 4 MG/2ML IJ SOLN
INTRAMUSCULAR | Status: AC
  Filled 2016-11-06: qty 2

## 2016-11-06 MED ORDER — SUGAMMADEX SODIUM 200 MG/2ML IV SOLN
INTRAVENOUS | Status: AC
  Filled 2016-11-06: qty 2

## 2016-11-06 MED ORDER — SODIUM CHLORIDE 0.9 % IR SOLN
Status: DC | PRN
  Administered 2016-11-06: 6000 mL

## 2016-11-06 MED ORDER — ROCURONIUM BROMIDE 50 MG/5ML IV SOSY
PREFILLED_SYRINGE | INTRAVENOUS | Status: DC | PRN
  Administered 2016-11-06: 25 mg via INTRAVENOUS

## 2016-11-06 MED ORDER — FENTANYL CITRATE (PF) 100 MCG/2ML IJ SOLN
25.0000 ug | INTRAMUSCULAR | Status: DC | PRN
  Filled 2016-11-06: qty 1

## 2016-11-06 MED ORDER — DEXAMETHASONE SODIUM PHOSPHATE 10 MG/ML IJ SOLN
INTRAMUSCULAR | Status: AC
  Filled 2016-11-06: qty 1

## 2016-11-06 MED ORDER — ARTIFICIAL TEARS OPHTHALMIC OINT
TOPICAL_OINTMENT | OPHTHALMIC | Status: AC
  Filled 2016-11-06: qty 3.5

## 2016-11-06 MED ORDER — SUCCINYLCHOLINE CHLORIDE 200 MG/10ML IV SOSY
PREFILLED_SYRINGE | INTRAVENOUS | Status: DC | PRN
  Administered 2016-11-06: 120 mg via INTRAVENOUS

## 2016-11-06 MED ORDER — DEXAMETHASONE SODIUM PHOSPHATE 4 MG/ML IJ SOLN
INTRAMUSCULAR | Status: DC | PRN
  Administered 2016-11-06: 10 mg via INTRAVENOUS

## 2016-11-06 MED ORDER — ONDANSETRON HCL 4 MG/2ML IJ SOLN
4.0000 mg | Freq: Once | INTRAMUSCULAR | Status: DC | PRN
  Filled 2016-11-06: qty 2

## 2016-11-06 MED ORDER — LACTATED RINGERS IV SOLN
INTRAVENOUS | Status: DC
  Administered 2016-11-06 (×2): via INTRAVENOUS
  Filled 2016-11-06: qty 1000

## 2016-11-06 MED ORDER — LIDOCAINE 2% (20 MG/ML) 5 ML SYRINGE
INTRAMUSCULAR | Status: AC
  Filled 2016-11-06: qty 5

## 2016-11-06 MED ORDER — CEPHALEXIN 500 MG PO CAPS: 500.0000 mg | ORAL_CAPSULE | Freq: Three times a day (TID) | ORAL | 0 refills | Status: DC

## 2016-11-06 MED ORDER — PROPOFOL 10 MG/ML IV BOLUS
INTRAVENOUS | Status: AC
  Filled 2016-11-06: qty 40

## 2016-11-06 MED ORDER — HYDROCODONE-ACETAMINOPHEN 5-325 MG PO TABS: 1.0000 | ORAL_TABLET | ORAL | 0 refills | Status: DC | PRN

## 2016-11-06 MED ORDER — FENTANYL CITRATE (PF) 100 MCG/2ML IJ SOLN
INTRAMUSCULAR | Status: AC
  Filled 2016-11-06: qty 2

## 2016-11-06 MED ORDER — CEFAZOLIN SODIUM-DEXTROSE 2-4 GM/100ML-% IV SOLN
INTRAVENOUS | Status: AC
  Filled 2016-11-06: qty 100

## 2016-11-06 MED ORDER — SUGAMMADEX SODIUM 200 MG/2ML IV SOLN
INTRAVENOUS | Status: DC | PRN
  Administered 2016-11-06: 200 mg via INTRAVENOUS

## 2016-11-06 MED ORDER — CEFAZOLIN SODIUM-DEXTROSE 2-4 GM/100ML-% IV SOLN
2.0000 g | INTRAVENOUS | Status: AC
  Administered 2016-11-06: 2 g via INTRAVENOUS
  Filled 2016-11-06: qty 100

## 2016-11-06 MED ORDER — EPHEDRINE SULFATE-NACL 50-0.9 MG/10ML-% IV SOSY
PREFILLED_SYRINGE | INTRAVENOUS | Status: DC | PRN
  Administered 2016-11-06: 5 mg via INTRAVENOUS
  Administered 2016-11-06: 15 mg via INTRAVENOUS

## 2016-11-06 MED ORDER — FENTANYL CITRATE (PF) 100 MCG/2ML IJ SOLN
INTRAMUSCULAR | Status: DC | PRN
  Administered 2016-11-06 (×2): 50 ug via INTRAVENOUS
  Administered 2016-11-06: 20 ug via INTRAVENOUS

## 2016-11-06 MED ORDER — EPHEDRINE 5 MG/ML INJ
INTRAVENOUS | Status: AC
  Filled 2016-11-06: qty 10

## 2016-11-06 MED ORDER — LIDOCAINE 2% (20 MG/ML) 5 ML SYRINGE
INTRAMUSCULAR | Status: DC | PRN
  Administered 2016-11-06: 20 mg via INTRAVENOUS
  Administered 2016-11-06: 100 mg via INTRAVENOUS

## 2016-11-06 MED ORDER — PROPOFOL 10 MG/ML IV BOLUS
INTRAVENOUS | Status: DC | PRN
  Administered 2016-11-06: 200 mg via INTRAVENOUS
  Administered 2016-11-06: 50 mg via INTRAVENOUS

## 2016-11-06 MED ORDER — ONDANSETRON HCL 4 MG/2ML IJ SOLN
INTRAMUSCULAR | Status: DC | PRN
  Administered 2016-11-06: 4 mg via INTRAVENOUS

## 2016-11-06 SURGICAL SUPPLY — 34 items
BAG DRAIN URO-CYSTO SKYTR STRL (DRAIN) ×3 IMPLANT
BAG DRN UROCATH (DRAIN) ×1
BAG URINE DRAINAGE (UROLOGICAL SUPPLIES) IMPLANT
BAG URINE LEG 500ML (DRAIN) ×2 IMPLANT
CATH FOLEY 2WAY SLVR  5CC 20FR (CATHETERS) ×2
CATH FOLEY 2WAY SLVR  5CC 22FR (CATHETERS)
CATH FOLEY 2WAY SLVR 30CC 20FR (CATHETERS) IMPLANT
CATH FOLEY 2WAY SLVR 5CC 20FR (CATHETERS) IMPLANT
CATH FOLEY 2WAY SLVR 5CC 22FR (CATHETERS) IMPLANT
CATH INTERMIT  6FR 70CM (CATHETERS) ×1 IMPLANT
CLOTH BEACON ORANGE TIMEOUT ST (SAFETY) ×3 IMPLANT
ELECT BIVAP BIPO 22/24 DONUT (ELECTROSURGICAL)
ELECT REM PT RETURN 9FT ADLT (ELECTROSURGICAL)
ELECTRD BIVAP BIPO 22/24 DONUT (ELECTROSURGICAL) IMPLANT
ELECTRODE REM PT RTRN 9FT ADLT (ELECTROSURGICAL) ×1 IMPLANT
EVACUATOR MICROVAS BLADDER (UROLOGICAL SUPPLIES) ×2 IMPLANT
GLOVE BIO SURGEON STRL SZ 6.5 (GLOVE) ×1 IMPLANT
GLOVE BIO SURGEON STRL SZ7.5 (GLOVE) ×3 IMPLANT
GLOVE BIO SURGEONS STRL SZ 6.5 (GLOVE) ×1
GLOVE BIOGEL PI IND STRL 6.5 (GLOVE) IMPLANT
GLOVE BIOGEL PI INDICATOR 6.5 (GLOVE) ×4
GOWN STRL REUS W/ TWL LRG LVL3 (GOWN DISPOSABLE) ×1 IMPLANT
GOWN STRL REUS W/ TWL XL LVL3 (GOWN DISPOSABLE) ×1 IMPLANT
GOWN STRL REUS W/TWL LRG LVL3 (GOWN DISPOSABLE) ×4 IMPLANT
GOWN STRL REUS W/TWL XL LVL3 (GOWN DISPOSABLE)
GUIDEWIRE STR DUAL SENSOR (WIRE) ×1 IMPLANT
KIT RM TURNOVER CYSTO AR (KITS) ×3 IMPLANT
LOOP CUT BIPOLAR 24F LRG (ELECTROSURGICAL) ×2 IMPLANT
MANIFOLD NEPTUNE II (INSTRUMENTS) ×3 IMPLANT
PACK CYSTO (CUSTOM PROCEDURE TRAY) ×3 IMPLANT
SET ASPIRATION TUBING (TUBING) IMPLANT
SYRINGE IRR TOOMEY STRL 70CC (SYRINGE) IMPLANT
TUBE CONNECTING 12'X1/4 (SUCTIONS)
TUBE CONNECTING 12X1/4 (SUCTIONS) IMPLANT

## 2016-11-06 NOTE — H&P (Signed)
CC: I have blood in my urine.  HPI: Steven Kline is a 76 year-old male established patient who is here for blood in the urine.  He did see the blood in his urine. He has seen blood clots.   He does not have a burning sensation when he urinates. He is not currently having trouble urinating.   He is not having pain.   Patient with episode of gross painless hematuria with small clots over 7 days. Urine culture was negative. That has since resolved. He still has residual microscopic hematuria on urinalysis today. No history of kidney stones or urinary tract infections. This has never happened before. He denies pain during these episodes. He has a long smoking history. He currently smokes.   CT urogram showed a 6 mm filling defect at the base of the bladder. 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 Has smoked since 08/11/1964. Smokes less than 1/2 pack per day.   Tobacco Use Assessment Completed: Used Tobacco in last 30 days? Drinks 4+ caffeinated drinks per day. Patient's occupation Hospital doctor.    REVIEW OF SYSTEMS:    GU Review Male:   Patient denies frequent urination, hard to postpone urination, burning/ pain with urination, get up at night to urinate, leakage of urine, stream starts and stops, trouble starting your stream, have to strain to urinate , erection problems, and penile pain.  Gastrointestinal (Upper):   Patient denies nausea, vomiting, and indigestion/ heartburn.  Gastrointestinal (Lower):   Patient denies diarrhea and constipation.   Constitutional:   Patient denies fever, night sweats, weight loss, and fatigue.  Skin:   Patient denies skin rash/ lesion and itching.  Eyes:   Patient denies blurred vision and double vision.  Ears/ Nose/ Throat:   Patient denies sore throat and sinus problems.  Hematologic/Lymphatic:   Patient denies swollen glands and easy bruising.  Cardiovascular:   Patient denies leg swelling and chest pains.  Respiratory:   Patient denies cough and shortness of breath.  Endocrine:   Patient denies excessive thirst.  Musculoskeletal:   Patient denies back pain and joint pain.  Neurological:   Patient denies headaches and dizziness.  Psychologic:   Patient denies depression and anxiety.   VITAL SIGNS:      09/18/2016 10:20 AM  BP 166/73 mmHg  Pulse 72 /min  Temperature 98.9 F / 37 C   MULTI-SYSTEM PHYSICAL EXAMINATION:    Constitutional: Well-nourished. No physical deformities. Normally developed. Good grooming.  Neck: Neck symmetrical, not swollen. Normal tracheal position.  Respiratory: No labored breathing, no use of accessory muscles.   Cardiovascular: Normal temperature, normal extremity pulses, no swelling, no varicosities.  Skin: No paleness, no jaundice, no cyanosis. No lesion, no ulcer, no rash.  Gastrointestinal: No mass, no tenderness, no rigidity, non obese abdomen.  Eyes: Normal conjunctivae. Normal eyelids.  Ears, Nose, Mouth, and Throat: Left ear no scars, no lesions, no masses. Right ear no scars, no lesions, no masses. Nose no scars, no lesions, no masses. Normal  hearing. Normal lips.  Musculoskeletal: Normal gait and station of head and neck.     PAST DATA REVIEWED:  Source Of History:  Patient  X-Ray Review: C.T. Hematuria: Reviewed Films. Reviewed Report. Discussed With Patient.     PROCEDURES:         Flexible Cystoscopy - 52000  Risks, benefits, and some of the potential complications of the procedure were discussed at length with the patient including infection,  bleeding, voiding discomfort, urinary retention, fever, chills, sepsis, and others. All questions were answered. Informed consent was obtained. Antibiotic prophylaxis was given. Sterile technique and intraurethral analgesia were used.  Meatus:  Normal size. Normal location. Normal condition.  Urethra:  No strictures.  External Sphincter:  Normal.  Verumontanum:  Normal.  Prostate:  Obstructing. Enlarged median lobe. Moderate hyperplasia.  Bladder Neck:  Non-obstructing.  Ureteral Orifices:  Normal location. Normal size. Normal shape. Effluxed clear urine.  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 This is consistent with what was seen on the CT scan.      The lower urinary tract was carefully examined. The procedure was well-tolerated and without complications. Antibiotic instructions were given. Instructions were given to call the office immediately for bloody urine, difficulty urinating, urinary retention, painful or frequent urination, fever, chills, nausea, vomiting or other illness. The patient stated that he understood these instructions and would comply with them.         Urinalysis w/Scope Dipstick Dipstick Cont'd Micro  Color: Yellow Bilirubin: Neg WBC/hpf: 0 - 5/hpf  Appearance: Clear Ketones: Neg RBC/hpf: 10 - 20/hpf  Specific Gravity: 1.025 Blood: 2+ Bacteria: NS (Not Seen)  pH: 6.0 Protein: Trace Cystals: NS (Not Seen)  Glucose: Neg Urobilinogen: 0.2 Casts: NS (Not Seen)    Nitrites: Neg Trichomonas: Not Present    Leukocyte Esterase: Neg Mucous: Not Present      Epithelial Cells: NS (Not Seen)      Yeast: NS (Not Seen)      Sperm: Not Present    ASSESSMENT:      ICD-10 Details  1 GU:   Gross hematuria - R31.0   2   Bladder tumor/neoplasm - D41.4    PLAN:           Orders Labs Urine Culture          Document Letter(s):  Created for W Gareth Eagle, MD   Created for Patient: Clinical Summary     The risks,  benefits, and some of the possible complications of transurethral resection of the bladder tumor were discussed with the patient including the anticipated cure rate, the possible need for additional surgical procedures, adjuvant chemotherapy, radiation therapy or immunotherapy. The possibility that this operative procedure might not to cure the underlying disease, that micrometastatic disease might already be present, and that this underlying disease might result in the death of the patient was discussed.  The general risks of the operative procedure and the perioperative period were discussed with the patient at length and in detail including swelling, pain, nausea, vomiting, fever, chills, infection, wound infection, sepsis, renal failure, internal or external bleeding, intraoperative bowel, organ or vascular injuries, postoperative formation of scar tissue, the need for blood transfusions, deep venous thrombosis or blood clots, pulmonary embolus, pneumonia, respiratory failure, heart attack, stroke, death and others.   Aternative treatment options were discussed with the patient in detail. All of the patient's questions were answered and he voiced an understanding of these  risks, benefits and possible complications.   The patient gave fully informed consent to proceed with a transurethral resection of their bladder tumor for the treatment of thier bladder cancer.        Notes:   1. Bladder lesion  I discussed with patient and his daughter that the lesion is concerning for TCC. He will undergo TURBT.   2. Gross hematuria  as above   3. Lung nodule  Patient was given a copy of his CT report that showed a lung nodule for which annual surveillance was recommended to 5 years. I discussed this with the patient and gave him a copy to his report. He will discuss surveillance imaging with his primary care provider at his next visit.

## 2016-11-06 NOTE — Op Note (Signed)
Date of procedure: 11/06/16  Preoperative diagnosis:  1. Bladder tumor   Postoperative diagnosis:  1. Bladder tumor 3  2. Meatal stenosis  Procedure: 1. Transurethral resection of bladder tumor 3 (largest 2 cm) 2. Meatal dilation  Surgeon: Baruch Gouty, MD  Anesthesia: General  Complications: None  Intraoperative findings: the patient had 3 separate bladder tumors One was on the median lobe of his prostatic urethra, one was medial to the left ureteral orifice. One was lateral to the left ureteral orifice. The largest was the one medial to the left ureteral orifice which was 2 years. These were resected down to muscle.  EBL: none  Specimens: bladder tumor to pathology  Drains: 20 French Foley catheter  Disposition: Stable to the postanesthesia care unit  Indication for procedure: The patient is a 76 y.o. male with history of gross hematuria where workup revealed a bladder tumor on the floor of his bladder.  He presents today for definitive surgical management.After reviewing the management options for treatment, the patient elected to proceed with the above surgical procedure(s). We have discussed the potential benefits and risks of the procedure, side effects of the proposed treatment, the likelihood of the patient achieving the goals of the procedure, and any potential problems that might occur during the procedure or recuperation. Informed consent has been obtained.  Description of procedure: The patient was met in the preoperative area. All risks, benefits, and indications of the procedure were described in great detail. The patient consented to the procedure. Preoperative antibiotics were given. The patient was taken to the operative theater. General anesthesia was induced per the anesthesia service. The patient was then placed in the dorsal lithotomy position and prepped and draped in the usual sterile fashion. A preoperative timeout was called.   An attempt was made to  place a 24 [place] resectoscope sheath with visual obturator. However, the patient had meatal stenosis. Using male urethral sounds and the anus was dilated to 28 [place] to accommodate the resectoscope. The scope was then placed with the visual obturator under direct visualization atraumatically. The patient was noted to have a 2 cm tumor on the median lobe of his prostate urethra. He also had a tumor approximately 2 cm in size medial to his left ureteral orifice. Another approximately 2 cm tumor lateral to the left ureteral orifice. All 3 tumors were then resected down to the muscle layers. The end of the procedure both ureteral orifices were intact. Hemostasis was obtained which was excellent. An muscle was seen in the specimen beds.The bladder chips were then evacuated with Ellik evacuator. Specimens were sent to pathology. Due to the resection of his prostatic urethra and his meatal stenosis, decision was made to leave a Foley catheter overnight to aid with hemostasis. A 20 French Foley catheter was placed. The patient was ANESTHESIA and transferred in stable condition to the postanesthesia care unit.  Plan: The patient's daughter is a nurse will remove his catheter in the morning. He will follow-up to discuss pathology in approximately one week.  Baruch Gouty, M.D.

## 2016-11-06 NOTE — Anesthesia Procedure Notes (Addendum)
Procedure Name: Intubation Date/Time: 11/06/2016 7:35 AM Performed by: Suzette Battiest Pre-anesthesia Checklist: Patient identified, Emergency Drugs available, Suction available and Patient being monitored Patient Re-evaluated:Patient Re-evaluated prior to induction Oxygen Delivery Method: Circle system utilized Preoxygenation: Pre-oxygenation with 100% oxygen Induction Type: IV induction Ventilation: Mask ventilation without difficulty Laryngoscope Size: Mac and 4 Grade View: Grade I Tube type: Oral Tube size: 8.0 mm Number of attempts: 1 Airway Equipment and Method: Stylet Placement Confirmation: ETT inserted through vocal cords under direct vision,  positive ETCO2 and breath sounds checked- equal and bilateral Secured at: 23 cm Tube secured with: Tape Dental Injury: Teeth and Oropharynx as per pre-operative assessment

## 2016-11-06 NOTE — Anesthesia Postprocedure Evaluation (Signed)
Anesthesia Post Note  Patient: Steven Kline  Procedure(s) Performed: Procedure(s) (LRB): TRANSURETHRAL RESECTION OF BLADDER TUMOR (TURBT), MEATAL DILITATION (N/A)     Patient location during evaluation: PACU Anesthesia Type: General Level of consciousness: awake and alert Pain management: pain level controlled Vital Signs Assessment: post-procedure vital signs reviewed and stable Respiratory status: spontaneous breathing, nonlabored ventilation, respiratory function stable and patient connected to nasal cannula oxygen Cardiovascular status: blood pressure returned to baseline and stable Postop Assessment: no signs of nausea or vomiting Anesthetic complications: no    Last Vitals:  Vitals:   11/06/16 0830 11/06/16 0845  BP: (!) 159/72 (!) 146/71  Pulse: 82 76  Resp: (!) 21 18  Temp:    SpO2: 100% 97%    Last Pain:  Vitals:   11/06/16 0601  TempSrc: Oral                 Tiajuana Amass

## 2016-11-06 NOTE — Discharge Instructions (Signed)

## 2016-11-06 NOTE — Transfer of Care (Signed)
Last Vitals:  Vitals:   11/06/16 0601 11/06/16 0820  BP: (!) 157/63 (!) 156/71  Pulse: 66 77  Resp: 16 15  Temp: 36.6 C 36.4 C  SpO2: 99% 96%    Last Pain:  Vitals:   11/06/16 0601  TempSrc: Oral      Patients Stated Pain Goal: 4 (11/06/16 4174)  Immediate Anesthesia Transfer of Care Note  Patient: Meridee Score  Procedure(s) Performed: Procedure(s) (LRB): TRANSURETHRAL RESECTION OF BLADDER TUMOR (TURBT), MEATAL DILITATION (N/A)  Patient Location: PACU  Anesthesia Type: General  Level of Consciousness: awake, alert  and oriented  Airway & Oxygen Therapy: Patient Spontanous Breathing and Patient connected to nasal cannula oxygen  Post-op Assessment: Report given to PACU RN and Post -op Vital signs reviewed and stable  Post vital signs: Reviewed and stable  Complications: No apparent anesthesia complications

## 2016-11-07 ENCOUNTER — Encounter (HOSPITAL_BASED_OUTPATIENT_CLINIC_OR_DEPARTMENT_OTHER): Payer: Self-pay | Admitting: Urology

## 2016-11-22 DIAGNOSIS — C678 Malignant neoplasm of overlapping sites of bladder: Secondary | ICD-10-CM | POA: Diagnosis not present

## 2017-01-03 DIAGNOSIS — Z5111 Encounter for antineoplastic chemotherapy: Secondary | ICD-10-CM | POA: Diagnosis not present

## 2017-01-03 DIAGNOSIS — C678 Malignant neoplasm of overlapping sites of bladder: Secondary | ICD-10-CM | POA: Diagnosis not present

## 2017-01-10 DIAGNOSIS — Z5111 Encounter for antineoplastic chemotherapy: Secondary | ICD-10-CM | POA: Diagnosis not present

## 2017-01-10 DIAGNOSIS — C678 Malignant neoplasm of overlapping sites of bladder: Secondary | ICD-10-CM | POA: Diagnosis not present

## 2017-01-17 DIAGNOSIS — C678 Malignant neoplasm of overlapping sites of bladder: Secondary | ICD-10-CM | POA: Diagnosis not present

## 2017-01-17 DIAGNOSIS — Z5111 Encounter for antineoplastic chemotherapy: Secondary | ICD-10-CM | POA: Diagnosis not present

## 2017-01-25 DIAGNOSIS — C678 Malignant neoplasm of overlapping sites of bladder: Secondary | ICD-10-CM | POA: Diagnosis not present

## 2017-01-25 DIAGNOSIS — Z5111 Encounter for antineoplastic chemotherapy: Secondary | ICD-10-CM | POA: Diagnosis not present

## 2017-01-31 DIAGNOSIS — C678 Malignant neoplasm of overlapping sites of bladder: Secondary | ICD-10-CM | POA: Diagnosis not present

## 2017-01-31 DIAGNOSIS — Z5111 Encounter for antineoplastic chemotherapy: Secondary | ICD-10-CM | POA: Diagnosis not present

## 2017-02-07 DIAGNOSIS — Z5111 Encounter for antineoplastic chemotherapy: Secondary | ICD-10-CM | POA: Diagnosis not present

## 2017-02-07 DIAGNOSIS — C678 Malignant neoplasm of overlapping sites of bladder: Secondary | ICD-10-CM | POA: Diagnosis not present

## 2017-02-20 DIAGNOSIS — H524 Presbyopia: Secondary | ICD-10-CM | POA: Diagnosis not present

## 2017-02-20 DIAGNOSIS — H43811 Vitreous degeneration, right eye: Secondary | ICD-10-CM | POA: Diagnosis not present

## 2017-02-20 DIAGNOSIS — H5212 Myopia, left eye: Secondary | ICD-10-CM | POA: Diagnosis not present

## 2017-02-20 DIAGNOSIS — H11153 Pinguecula, bilateral: Secondary | ICD-10-CM | POA: Diagnosis not present

## 2017-02-20 DIAGNOSIS — E113293 Type 2 diabetes mellitus with mild nonproliferative diabetic retinopathy without macular edema, bilateral: Secondary | ICD-10-CM | POA: Diagnosis not present

## 2017-02-20 DIAGNOSIS — H25813 Combined forms of age-related cataract, bilateral: Secondary | ICD-10-CM | POA: Diagnosis not present

## 2017-03-22 DIAGNOSIS — E118 Type 2 diabetes mellitus with unspecified complications: Secondary | ICD-10-CM | POA: Diagnosis not present

## 2017-03-22 DIAGNOSIS — Z0000 Encounter for general adult medical examination without abnormal findings: Secondary | ICD-10-CM | POA: Diagnosis not present

## 2017-03-22 DIAGNOSIS — Z125 Encounter for screening for malignant neoplasm of prostate: Secondary | ICD-10-CM | POA: Diagnosis not present

## 2017-03-22 DIAGNOSIS — E7800 Pure hypercholesterolemia, unspecified: Secondary | ICD-10-CM | POA: Diagnosis not present

## 2017-03-22 DIAGNOSIS — I10 Essential (primary) hypertension: Secondary | ICD-10-CM | POA: Diagnosis not present

## 2017-03-26 DIAGNOSIS — Z1211 Encounter for screening for malignant neoplasm of colon: Secondary | ICD-10-CM | POA: Diagnosis not present

## 2017-03-26 DIAGNOSIS — Z8719 Personal history of other diseases of the digestive system: Secondary | ICD-10-CM | POA: Diagnosis not present

## 2017-03-26 DIAGNOSIS — I10 Essential (primary) hypertension: Secondary | ICD-10-CM | POA: Diagnosis not present

## 2017-03-26 DIAGNOSIS — G4733 Obstructive sleep apnea (adult) (pediatric): Secondary | ICD-10-CM | POA: Diagnosis not present

## 2017-03-26 DIAGNOSIS — F17290 Nicotine dependence, other tobacco product, uncomplicated: Secondary | ICD-10-CM | POA: Diagnosis not present

## 2017-03-26 DIAGNOSIS — E789 Disorder of lipoprotein metabolism, unspecified: Secondary | ICD-10-CM | POA: Diagnosis not present

## 2017-03-26 DIAGNOSIS — Z6830 Body mass index (BMI) 30.0-30.9, adult: Secondary | ICD-10-CM | POA: Diagnosis not present

## 2017-03-26 DIAGNOSIS — I119 Hypertensive heart disease without heart failure: Secondary | ICD-10-CM | POA: Diagnosis not present

## 2017-03-26 DIAGNOSIS — E1121 Type 2 diabetes mellitus with diabetic nephropathy: Secondary | ICD-10-CM | POA: Diagnosis not present

## 2017-03-26 DIAGNOSIS — E11319 Type 2 diabetes mellitus with unspecified diabetic retinopathy without macular edema: Secondary | ICD-10-CM | POA: Diagnosis not present

## 2017-03-26 DIAGNOSIS — Z1212 Encounter for screening for malignant neoplasm of rectum: Secondary | ICD-10-CM | POA: Diagnosis not present

## 2017-05-23 DIAGNOSIS — R8271 Bacteriuria: Secondary | ICD-10-CM | POA: Diagnosis not present

## 2017-05-23 DIAGNOSIS — C678 Malignant neoplasm of overlapping sites of bladder: Secondary | ICD-10-CM | POA: Diagnosis not present

## 2017-05-25 ENCOUNTER — Other Ambulatory Visit: Payer: Self-pay | Admitting: Urology

## 2017-05-25 DIAGNOSIS — N3289 Other specified disorders of bladder: Secondary | ICD-10-CM | POA: Diagnosis not present

## 2017-05-28 ENCOUNTER — Encounter (HOSPITAL_BASED_OUTPATIENT_CLINIC_OR_DEPARTMENT_OTHER): Payer: Self-pay | Admitting: *Deleted

## 2017-05-28 ENCOUNTER — Other Ambulatory Visit: Payer: Self-pay

## 2017-05-28 NOTE — Progress Notes (Signed)
Spoke with Cliford Npo after midnight , arrive 1030 am 06-06-17 [facility]  meds to take none  ekg 11-06-16 epic/chart Driver daughter denise aldridge will stay for procedure Needs I stat 4 has pre op orders

## 2017-06-06 ENCOUNTER — Ambulatory Visit (HOSPITAL_BASED_OUTPATIENT_CLINIC_OR_DEPARTMENT_OTHER): Payer: Medicare Other | Admitting: Anesthesiology

## 2017-06-06 ENCOUNTER — Encounter (HOSPITAL_BASED_OUTPATIENT_CLINIC_OR_DEPARTMENT_OTHER): Payer: Self-pay | Admitting: Anesthesiology

## 2017-06-06 ENCOUNTER — Ambulatory Visit (HOSPITAL_BASED_OUTPATIENT_CLINIC_OR_DEPARTMENT_OTHER)
Admission: RE | Admit: 2017-06-06 | Discharge: 2017-06-06 | Disposition: A | Discharge status: Home / Self Care | Payer: Medicare Other | Source: Ambulatory Visit | Attending: Urology | Admitting: Urology

## 2017-06-06 ENCOUNTER — Encounter (HOSPITAL_BASED_OUTPATIENT_CLINIC_OR_DEPARTMENT_OTHER)
Admission: RE | Disposition: A | Discharge status: Home / Self Care | Payer: Self-pay | Source: Ambulatory Visit | Attending: Urology

## 2017-06-06 DIAGNOSIS — Z7984 Long term (current) use of oral hypoglycemic drugs: Secondary | ICD-10-CM | POA: Insufficient documentation

## 2017-06-06 DIAGNOSIS — G4730 Sleep apnea, unspecified: Secondary | ICD-10-CM | POA: Insufficient documentation

## 2017-06-06 DIAGNOSIS — I10 Essential (primary) hypertension: Secondary | ICD-10-CM | POA: Insufficient documentation

## 2017-06-06 DIAGNOSIS — E119 Type 2 diabetes mellitus without complications: Secondary | ICD-10-CM | POA: Diagnosis not present

## 2017-06-06 DIAGNOSIS — F17210 Nicotine dependence, cigarettes, uncomplicated: Secondary | ICD-10-CM | POA: Diagnosis not present

## 2017-06-06 DIAGNOSIS — C672 Malignant neoplasm of lateral wall of bladder: Secondary | ICD-10-CM | POA: Diagnosis not present

## 2017-06-06 DIAGNOSIS — C678 Malignant neoplasm of overlapping sites of bladder: Secondary | ICD-10-CM

## 2017-06-06 DIAGNOSIS — Z79899 Other long term (current) drug therapy: Secondary | ICD-10-CM | POA: Insufficient documentation

## 2017-06-06 DIAGNOSIS — C679 Malignant neoplasm of bladder, unspecified: Secondary | ICD-10-CM | POA: Diagnosis not present

## 2017-06-06 HISTORY — PX: TRANSURETHRAL RESECTION OF BLADDER TUMOR: SHX2575

## 2017-06-06 LAB — POCT I-STAT 4, (NA,K, GLUC, HGB,HCT)
Glucose, Bld: 122 mg/dL — ABNORMAL HIGH (ref 65–99)
HCT: 43 % (ref 39.0–52.0)
Hemoglobin: 14.6 g/dL (ref 13.0–17.0)
Potassium: 4.4 mmol/L (ref 3.5–5.1)
Sodium: 142 mmol/L (ref 135–145)

## 2017-06-06 SURGERY — TURBT (TRANSURETHRAL RESECTION OF BLADDER TUMOR)
Anesthesia: General | Site: Bladder

## 2017-06-06 MED ORDER — EPHEDRINE SULFATE-NACL 50-0.9 MG/10ML-% IV SOSY
PREFILLED_SYRINGE | INTRAVENOUS | Status: DC | PRN
  Administered 2017-06-06: 10 mg via INTRAVENOUS

## 2017-06-06 MED ORDER — PHENYLEPHRINE 40 MCG/ML (10ML) SYRINGE FOR IV PUSH (FOR BLOOD PRESSURE SUPPORT)
PREFILLED_SYRINGE | INTRAVENOUS | Status: DC | PRN
  Administered 2017-06-06: 40 ug via INTRAVENOUS

## 2017-06-06 MED ORDER — OXYCODONE HCL 5 MG PO TABS
5.0000 mg | ORAL_TABLET | Freq: Once | ORAL | Status: DC | PRN
  Filled 2017-06-06: qty 1

## 2017-06-06 MED ORDER — CEFAZOLIN SODIUM-DEXTROSE 2-4 GM/100ML-% IV SOLN
2.0000 g | INTRAVENOUS | Status: AC
  Administered 2017-06-06: 2 g via INTRAVENOUS
  Filled 2017-06-06: qty 100

## 2017-06-06 MED ORDER — HYDROCODONE-ACETAMINOPHEN 5-325 MG PO TABS: 1.0000 | ORAL_TABLET | ORAL | 0 refills | Status: DC | PRN

## 2017-06-06 MED ORDER — CEPHALEXIN 500 MG PO CAPS: 500.0000 mg | ORAL_CAPSULE | Freq: Three times a day (TID) | ORAL | 0 refills | Status: DC

## 2017-06-06 MED ORDER — FENTANYL CITRATE (PF) 100 MCG/2ML IJ SOLN
INTRAMUSCULAR | Status: DC | PRN
  Administered 2017-06-06 (×2): 25 ug via INTRAVENOUS
  Administered 2017-06-06: 50 ug via INTRAVENOUS

## 2017-06-06 MED ORDER — ONDANSETRON HCL 4 MG/2ML IJ SOLN
INTRAMUSCULAR | Status: DC | PRN
  Administered 2017-06-06: 4 mg via INTRAVENOUS

## 2017-06-06 MED ORDER — HYDROMORPHONE HCL 1 MG/ML IJ SOLN
0.2500 mg | INTRAMUSCULAR | Status: DC | PRN
  Filled 2017-06-06: qty 0.5

## 2017-06-06 MED ORDER — LIDOCAINE 2% (20 MG/ML) 5 ML SYRINGE
INTRAMUSCULAR | Status: AC
  Filled 2017-06-06: qty 5

## 2017-06-06 MED ORDER — FENTANYL CITRATE (PF) 100 MCG/2ML IJ SOLN
INTRAMUSCULAR | Status: AC
  Filled 2017-06-06: qty 2

## 2017-06-06 MED ORDER — ONDANSETRON HCL 4 MG/2ML IJ SOLN
INTRAMUSCULAR | Status: AC
  Filled 2017-06-06: qty 2

## 2017-06-06 MED ORDER — LACTATED RINGERS IV SOLN
INTRAVENOUS | Status: DC
  Administered 2017-06-06 (×2): via INTRAVENOUS
  Filled 2017-06-06: qty 1000

## 2017-06-06 MED ORDER — CEFAZOLIN SODIUM-DEXTROSE 2-4 GM/100ML-% IV SOLN
INTRAVENOUS | Status: AC
  Filled 2017-06-06: qty 100

## 2017-06-06 MED ORDER — GLYCOPYRROLATE 0.2 MG/ML IV SOSY
PREFILLED_SYRINGE | INTRAVENOUS | Status: AC
  Filled 2017-06-06: qty 5

## 2017-06-06 MED ORDER — SODIUM CHLORIDE 0.9 % IR SOLN
Status: DC | PRN
  Administered 2017-06-06: 3000 mL

## 2017-06-06 MED ORDER — PROPOFOL 10 MG/ML IV BOLUS
INTRAVENOUS | Status: DC | PRN
  Administered 2017-06-06: 150 mg via INTRAVENOUS

## 2017-06-06 MED ORDER — PROMETHAZINE HCL 25 MG/ML IJ SOLN
6.2500 mg | INTRAMUSCULAR | Status: DC | PRN
  Filled 2017-06-06: qty 1

## 2017-06-06 MED ORDER — EPHEDRINE 5 MG/ML INJ
INTRAVENOUS | Status: AC
  Filled 2017-06-06: qty 10

## 2017-06-06 MED ORDER — OXYCODONE HCL 5 MG/5ML PO SOLN
5.0000 mg | Freq: Once | ORAL | Status: DC | PRN
  Filled 2017-06-06: qty 5

## 2017-06-06 MED ORDER — LIDOCAINE HCL (CARDIAC) 20 MG/ML IV SOLN
INTRAVENOUS | Status: DC | PRN
  Administered 2017-06-06: 80 mg via INTRAVENOUS

## 2017-06-06 MED ORDER — DEXAMETHASONE SODIUM PHOSPHATE 10 MG/ML IJ SOLN
INTRAMUSCULAR | Status: AC
  Filled 2017-06-06: qty 1

## 2017-06-06 MED ORDER — PHENYLEPHRINE 40 MCG/ML (10ML) SYRINGE FOR IV PUSH (FOR BLOOD PRESSURE SUPPORT)
PREFILLED_SYRINGE | INTRAVENOUS | Status: AC
  Filled 2017-06-06: qty 10

## 2017-06-06 MED ORDER — PROPOFOL 10 MG/ML IV BOLUS
INTRAVENOUS | Status: AC
  Filled 2017-06-06: qty 20

## 2017-06-06 SURGICAL SUPPLY — 26 items
BAG DRAIN URO-CYSTO SKYTR STRL (DRAIN) ×3 IMPLANT
BAG DRN UROCATH (DRAIN) ×1
BAG URINE DRAINAGE (UROLOGICAL SUPPLIES) IMPLANT
BAG URINE LEG 500ML (DRAIN) IMPLANT
CATH FOLEY 2WAY SLVR  5CC 22FR (CATHETERS)
CATH FOLEY 2WAY SLVR 30CC 20FR (CATHETERS) IMPLANT
CATH FOLEY 2WAY SLVR 5CC 22FR (CATHETERS) IMPLANT
CLOTH BEACON ORANGE TIMEOUT ST (SAFETY) ×3 IMPLANT
ELECT BIVAP BIPO 22/24 DONUT (ELECTROSURGICAL)
ELECT REM PT RETURN 9FT ADLT (ELECTROSURGICAL)
ELECTRD BIVAP BIPO 22/24 DONUT (ELECTROSURGICAL) IMPLANT
ELECTRODE REM PT RTRN 9FT ADLT (ELECTROSURGICAL) ×1 IMPLANT
EVACUATOR MICROVAS BLADDER (UROLOGICAL SUPPLIES) IMPLANT
GLOVE BIO SURGEON STRL SZ7.5 (GLOVE) ×3 IMPLANT
GLOVE BIOGEL PI IND STRL 8.5 (GLOVE) IMPLANT
GLOVE BIOGEL PI INDICATOR 8.5 (GLOVE) ×2
GLOVE INDICATOR 8.5 STRL (GLOVE) ×2 IMPLANT
GOWN STRL REUS W/ TWL XL LVL3 (GOWN DISPOSABLE) ×1 IMPLANT
GOWN STRL REUS W/TWL XL LVL3 (GOWN DISPOSABLE) ×6
KIT TURNOVER CYSTO (KITS) ×3 IMPLANT
LOOP CUT BIPOLAR 24F LRG (ELECTROSURGICAL) IMPLANT
MANIFOLD NEPTUNE II (INSTRUMENTS) ×3 IMPLANT
PACK CYSTO (CUSTOM PROCEDURE TRAY) ×3 IMPLANT
SYRINGE IRR TOOMEY STRL 70CC (SYRINGE) IMPLANT
TUBE CONNECTING 12'X1/4 (SUCTIONS)
TUBE CONNECTING 12X1/4 (SUCTIONS) IMPLANT

## 2017-06-06 NOTE — Op Note (Signed)
Date of procedure: 06/06/17  Preoperative diagnosis:  1. Bladder cancer  Postoperative diagnosis:  1. Bladder cancer  Procedure: 1. Transurethral resection of bladder tumor x2 (2 cm each)  Surgeon: Baruch Gouty, MD  Anesthesia: General  Complications: None  Intraoperative findings: The patient had a lesion considered with an exophytic early recurrence of bladder cancer in the left lateral wall that is approximately 2 cm.  He also had an erythematous area with minor exophytic features in the posterior left wall that was also 2 cm.  Both of these were resected and sent to pathology as one specimen.  EBL: None  Specimens: Bladder tumor to pathology  Drains: None  Disposition: Stable to the postanesthesia care unit  Indication for procedure: The patient is a 77 y.o. male with history of multifocal high-grade pTa bladder cancer who was found on his first surveillance cystoscopy after induction BCG to have recurrence of the tumor presents today for TURBT.  After reviewing the management options for treatment, the patient elected to proceed with the above surgical procedure(s). We have discussed the potential benefits and risks of the procedure, side effects of the proposed treatment, the likelihood of the patient achieving the goals of the procedure, and any potential problems that might occur during the procedure or recuperation. Informed consent has been obtained.  Description of procedure: The patient was met in the preoperative area. All risks, benefits, and indications of the procedure were described in great detail. The patient consented to the procedure. Preoperative antibiotics were given. The patient was taken to the operative theater. General anesthesia was induced per the anesthesia service. The patient was then placed in the dorsal lithotomy position and prepped and draped in the usual sterile fashion. A preoperative timeout was called.   A 24 French 30 degree resectoscope  sheath with visual obturator was inserted into the patient's bladder per urethra atraumatically.  Pan cystoscopy revealed a 2 cm tumor in the left lateral bladder wall.  There is also a lesion concerning for another early recurrence in the left posterior wall.  They were both well away from the left ureteral orifice.  Both were resected down to muscle in entirety.  Hemostasis was obtained and was excellent.  The tumor chips were evacuated and sent to pathology as one specimen.  No further tumors were seen on pan cystoscopy.  At this point the patient's bladder was drained.  He was woke from anesthesia and transferred in stable condition to the postanesthesia care unit.  Plan: The patient will follow-up in 1 week to discuss pathology results.  If pathology is positive for recurrent disease which I suspect it will be, the patient will need repeat induction BCG in 3 months.  Baruch Gouty, M.D.

## 2017-06-06 NOTE — Anesthesia Procedure Notes (Signed)
Procedure Name: LMA Insertion Date/Time: 06/06/2017 12:29 PM Performed by: Georgeanne Nim, CRNA Pre-anesthesia Checklist: Patient identified, Emergency Drugs available, Suction available, Patient being monitored and Timeout performed Patient Re-evaluated:Patient Re-evaluated prior to induction Oxygen Delivery Method: Circle system utilized Preoxygenation: Pre-oxygenation with 100% oxygen Induction Type: IV induction Ventilation: Mask ventilation without difficulty LMA: LMA inserted LMA Size: 4.0 Number of attempts: 1 Placement Confirmation: positive ETCO2,  CO2 detector and breath sounds checked- equal and bilateral Tube secured with: Tape Dental Injury: Teeth and Oropharynx as per pre-operative assessment

## 2017-06-06 NOTE — H&P (Signed)
CC: I have bladder cancer.  HPI: Steven Kline is a 77 year-old male established patient who is here for bladder cancer.  His problem was diagnosed 11/06/2016. His bladder cancer was diagnosed by Dr. Pilar Jarvis. The bladder cancer was found because of blood in his urine.   His bladder cancer was treated by removal with scope. Patient denies removal of the entire bladder, radiation, and chemotherapy.   He does have a good appetite. His bowels are moving normally. He is not having pain in new locations. He has not recently had unwanted weight loss.   Patient returns for follow-up after undergoing TURBT for 3 bladder tumors seen on an otherwise negative gross hematuria workup in August 2018. The largest tumor was approximately 2 cm. Pathology showed pTa high grade TCC. Muscle was present in the specimen and not involved with disease. He completed induction BCG in November 2018.     ALLERGIES: None   MEDICATIONS: Lisinopril 20 mg tablet  Glyburide  Pravastatin Sodium 80 mg tablet     GU PSH: Bladder Instill AntiCA Agent - 02/07/2017, 01/31/2017, 01/25/2017, 01/17/2017, 01/10/2017, 01/03/2017 Cystoscopy - 09/18/2016 Cystoscopy TURBT 2-5 cm - 11/06/2016 Locm 300-399Mg /Ml Iodine,1Ml - 08/30/2016    NON-GU PSH: None   GU PMH: Bladder Cancer overlapping sites - 11/22/2016 Bladder tumor/neoplasm - 09/18/2016 Gross hematuria - 09/18/2016, - 08/21/2016 BPH w/o LUTS - 08/21/2016    NON-GU PMH: Diabetes Type 2    FAMILY HISTORY: 2 daughters - Daughter 1 son - Son   SOCIAL HISTORY: Marital Status: Widowed Preferred Language: English; Ethnicity: Not Hispanic Or Latino; Race: White Current Smoking Status: Patient smokes. Has smoked since 08/11/1964. Smokes less than 1/2 pack per day.   Tobacco Use Assessment Completed: Used Tobacco in last 30 days? Drinks 4+ caffeinated drinks per day. Patient's occupation Hospital doctor.    REVIEW OF SYSTEMS:    GU Review Male:   Patient reports frequent  urination and get up at night to urinate. Patient denies hard to postpone urination, burning/ pain with urination, leakage of urine, stream starts and stops, trouble starting your stream, have to strain to urinate , erection problems, and penile pain.  Gastrointestinal (Upper):   Patient denies nausea, vomiting, and indigestion/ heartburn.  Gastrointestinal (Lower):   Patient denies diarrhea and constipation.  Constitutional:   Patient denies fever, night sweats, weight loss, and fatigue.  Skin:   Patient denies skin rash/ lesion and itching.  Eyes:   Patient denies blurred vision and double vision.  Ears/ Nose/ Throat:   Patient denies sore throat and sinus problems.  Hematologic/Lymphatic:   Patient denies swollen glands and easy bruising.  Cardiovascular:   Patient denies leg swelling and chest pains.  Respiratory:   Patient denies cough and shortness of breath.  Endocrine:   Patient denies excessive thirst.  Musculoskeletal:   Patient denies back pain and joint pain.  Neurological:   Patient denies headaches and dizziness.  Psychologic:   Patient denies depression and anxiety.   VITAL SIGNS:      05/23/2017 02:27 PM  Weight 224 lb / 101.6 kg  Height 72 in / 182.88 cm  BP 160/73 mmHg  Pulse 80 /min  Temperature 97.8 F / 36.5 C  BMI 30.4 kg/m   MULTI-SYSTEM PHYSICAL EXAMINATION:    Constitutional: Well-nourished. No physical deformities. Normally developed. Good grooming.  Neck: Neck symmetrical, not swollen. Normal tracheal position.  Respiratory: No labored breathing, no use of accessory muscles. Normal breath sounds.  Cardiovascular: Regular rate and  rhythm. No murmur, no gallop. Normal temperature, normal extremity pulses, no swelling, no varicosities.  Skin: No paleness, no jaundice, no cyanosis. No lesion, no ulcer, no rash.  Gastrointestinal: No mass, no tenderness, no rigidity, non obese abdomen.  Eyes: Normal conjunctivae. Normal eyelids.  Ears, Nose, Mouth, and Throat:  Left ear no scars, no lesions, no masses. Right ear no scars, no lesions, no masses. Nose no scars, no lesions, no masses. Normal hearing. Normal lips.  Musculoskeletal: Normal gait and station of head and neck.     PAST DATA REVIEWED:  Source Of History:  Patient  Records Review:   Pathology Reports, Previous Hospital Records, Previous Patient Records  Urine Test Review:   Urinalysis   PROCEDURES:         Flexible Cystoscopy - 52000  Risks, benefits, and some of the potential complications of the procedure were discussed at length with the patient including infection, bleeding, voiding discomfort, urinary retention, fever, chills, sepsis, and others. All questions were answered. Informed consent was obtained. Antibiotic prophylaxis was given. Sterile technique and intraurethral analgesia were used.  Meatus:  Normal size. Normal location. Normal condition.  Urethra:  No strictures.  External Sphincter:  Normal.  Verumontanum:  Normal.  Prostate:  Obstructing. Moderate hyperplasia.  Bladder Neck:  Non-obstructing.  Ureteral Orifices:  Normal location. Normal size. Normal shape. Effluxed clear urine.  Bladder:  Small slightly exophytic 2-3 cm lesion in the upper midline of the posterior wall of the bladder. Concerning for early recurrence.      The lower urinary tract was carefully examined. The procedure was well-tolerated and without complications. Antibiotic instructions were given. Instructions were given to call the office immediately for bloody urine, difficulty urinating, urinary retention, painful or frequent urination, fever, chills, nausea, vomiting or other illness. The patient stated that he understood these instructions and would comply with them.         Urinalysis - 81003 Dipstick Dipstick Cont'd  Color: Yellow Bilirubin: Neg  Appearance: Clear Ketones: Neg  Specific Gravity: 1.025 Blood: Neg  pH: 6.0 Protein: Trace  Glucose: Neg Urobilinogen: 0.2    Nitrites: Neg     Leukocyte Esterase: Neg    Notes:      ASSESSMENT:      ICD-10 Details  1 GU:   Bladder Cancer overlapping sites - C67.8    PLAN:           Orders Labs Urine Culture          Document Letter(s):  Created for Arleta [place], MD   Created for Patient: Clinical Summary         Notes:   1. Multifocal high-grade pTa TCC  -I discussed the patient that he has a possible recurrence of his tumor within his bladder. If this is a recurrence, it is likely very early. We'll schedule him for repeat TURBT. He is very familiar with this procedure as he has been through it before.

## 2017-06-06 NOTE — Anesthesia Postprocedure Evaluation (Signed)
Anesthesia Post Note  Patient: Steven Kline  Procedure(s) Performed: TRANSURETHRAL RESECTION OF BLADDER TUMOR (TURBT) (N/A Bladder)     Patient location during evaluation: PACU Anesthesia Type: General Level of consciousness: awake and alert and oriented Pain management: pain level controlled Vital Signs Assessment: post-procedure vital signs reviewed and stable Respiratory status: spontaneous breathing, nonlabored ventilation and respiratory function stable Cardiovascular status: blood pressure returned to baseline and stable Postop Assessment: no apparent nausea or vomiting Anesthetic complications: no    Last Vitals:  Vitals:   06/06/17 1315 06/06/17 1330  BP: 119/69 126/65  Pulse: 67 78  Resp: 19 15  Temp:    SpO2: 96% 97%    Last Pain:  Vitals:   06/06/17 1400  TempSrc:   PainSc: 0-No pain                 Tessia Kassin A.

## 2017-06-06 NOTE — Interval H&P Note (Signed)
History and Physical Interval Note:  06/06/2017 12:08 PM  Steven Kline  has presented today for surgery, with the diagnosis of BLADDER CANCER  The various methods of treatment have been discussed with the patient and family. After consideration of risks, benefits and other options for treatment, the patient has consented to  Procedure(s): TRANSURETHRAL RESECTION OF BLADDER TUMOR (TURBT) (N/A) as a surgical intervention .  The patient's history has been reviewed, patient examined, no change in status, stable for surgery.  I have reviewed the patient's chart and labs.  Questions were answered to the patient's satisfaction.     [name]

## 2017-06-06 NOTE — Anesthesia Preprocedure Evaluation (Addendum)
Anesthesia Evaluation  Patient identified by MRN, date of birth, ID band Patient awake    Reviewed: Allergy & Precautions, NPO status , Patient's Chart, lab work & pertinent test results  Airway Mallampati: II  TM Distance: >3 FB Neck ROM: Full    Dental no notable dental hx.    Pulmonary sleep apnea , Current Smoker,    Pulmonary exam normal breath sounds clear to auscultation       Cardiovascular hypertension, Pt. on medications Normal cardiovascular exam Rhythm:Regular Rate:Normal  ECG: NSR, rate 59  Daughters report pt had stress testing and echo with Dr. Einar Gip earlier this summer that were reportedly normal.     Neuro/Psych negative neurological ROS  negative psych ROS   GI/Hepatic negative GI ROS, Neg liver ROS,   Endo/Other  diabetes, Oral Hypoglycemic Agents  Renal/GU negative Renal ROS     Musculoskeletal negative musculoskeletal ROS (+)   Abdominal   Peds  Hematology HLD   Anesthesia Other Findings BLADDER CANCER  Reproductive/Obstetrics                            Anesthesia Physical Anesthesia Plan  ASA: III  Anesthesia Plan: General   Post-op Pain Management:    Induction: Intravenous  PONV Risk Score and Plan: 1 and Ondansetron, Dexamethasone and Treatment may vary due to age or medical condition  Airway Management Planned: LMA  Additional Equipment:   Intra-op Plan:   Post-operative Plan: Extubation in OR  Informed Consent: I have reviewed the patients History and Physical, chart, labs and discussed the procedure including the risks, benefits and alternatives for the proposed anesthesia with the patient or authorized representative who has indicated his/her understanding and acceptance.   Dental advisory given  Plan Discussed with: CRNA  Anesthesia Plan Comments:         Anesthesia Quick Evaluation

## 2017-06-06 NOTE — Discharge Instructions (Signed)
Post Anesthesia Home Care Instructions  Activity: Get plenty of rest for the remainder of the day. A responsible individual must stay with you for 24 hours following the procedure.  For the next 24 hours, DO NOT: -Drive a car -Paediatric nurse -Drink alcoholic beverages -Take any medication unless instructed by your physician -Make any legal decisions or sign important papers.  Meals: Start with liquid foods such as gelatin or soup. Progress to regular foods as tolerated. Avoid greasy, spicy, heavy foods. If nausea and/or vomiting occur, drink only clear liquids until the nausea and/or vomiting subsides. Call your physician if vomiting continues.  Special Instructions/Symptoms: Your throat may feel dry or sore from the anesthesia or the breathing tube placed in your throat during surgery. If this causes discomfort, gargle with warm salt water. The discomfort should disappear within 24 hours.  If you had a scopolamine patch placed behind your ear for the management of post- operative nausea and/or vomiting:  1. The medication in the patch is effective for 72 hours, after which it should be removed.  Wrap patch in a tissue and discard in the trash. Wash hands thoroughly with soap and water. 2. You may remove the patch earlier than 72 hours if you experience unpleasant side effects which may include dry mouth, dizziness or visual disturbances. 3. Avoid touching the patch. Wash your hands with soap and water after contact with the patch.    Transurethral Resection of Bladder Tumor, Care After Refer to this sheet in the next few weeks. These instructions provide you with information about caring for yourself after your procedure. Your health care provider may also give you more specific instructions. Your treatment has been planned according to current medical practices, but problems sometimes occur. Call your health care provider if you have any problems or questions after your  procedure. What can I expect after the procedure? After the procedure, it is common to have:  A small amount of blood in your urine for up to 2 weeks.  Soreness or mild discomfort from your catheter. After your catheter is removed, you may have mild soreness, especially when urinating.  Pain in your lower abdomen.  Follow these instructions at home:  Medicines  Take over-the-counter and prescription medicines only as told by your health care provider.  Do not drive or operate heavy machinery while taking prescription pain medicine.  Do not drive for 24 hours if you received a sedative.  If you were prescribed antibiotic medicine, take it as told by your health care provider. Do not stop taking the antibiotic even if you start to feel better. Activity  Return to your normal activities as told by your health care provider. Ask your health care provider what activities are safe for you.  Do not lift anything that is heavier than 10 lb (4.5 kg) for as long as told by your health care provider.  Avoid intense physical activity for as long as told by your health care provider.  Walk at least one time every day. This helps to prevent blood clots. You may increase your physical activity gradually as you start to feel better. General instructions  Do not drink alcohol for as long as told by your health care provider. This is especially important if you are taking prescription pain medicines.  Do not take baths, swim, or use a hot tub until your health care provider approves.  If you have a catheter, follow instructions from your health care provider about caring for your catheter  and your drainage bag.  Drink enough fluid to keep your urine clear or pale yellow.  Wear compression stockings as told by your health care provider. These stockings help to prevent blood clots and reduce swelling in your legs.  Keep all follow-up visits as told by your health care provider. This is  important. Contact a health care provider if:  You have pain that gets worse or does not improve with medicine.  You have blood in your urine for more than 2 weeks.  You have cloudy or bad-smelling urine.  You become constipated. Signs of constipation may include having: ? Fewer than three bowel movements in a week. ? Difficulty having a bowel movement. ? Stools that are dry, hard, or larger than normal.  You have a fever. Get help right away if:  You have: ? Severe pain. ? Bright-red blood in your urine. ? Blood clots in your urine. ? A lot of blood in your urine.  Your catheter has been removed and you are not able to urinate.  You have a catheter in place and the catheter is not draining urine. This information is not intended to replace advice given to you by your health care provider. Make sure you discuss any questions you have with your health care provider. Document Released: 02/08/2015 Document Revised: 10/31/2015 Document Reviewed: 11/19/2014 Elsevier Interactive Patient Education  2018 [employer].

## 2017-06-06 NOTE — Transfer of Care (Signed)
Immediate Anesthesia Transfer of Care Note  Patient: Steven Kline  Procedure(s) Performed: TRANSURETHRAL RESECTION OF BLADDER TUMOR (TURBT) (N/A Bladder)  Patient Location: PACU  Anesthesia Type:General  Level of Consciousness: awake, alert  and patient cooperative  Airway & Oxygen Therapy: Patient Spontanous Breathing and Patient connected to nasal cannula oxygen  Post-op Assessment: Report given to RN and Post -op Vital signs reviewed and stable  Post vital signs: Reviewed and stable  Last Vitals:  Vitals Value Taken Time  BP 123/65 06/06/2017  1:00 PM  Temp    Pulse 64 06/06/2017  1:01 PM  Resp 15 06/06/2017  1:01 PM  SpO2 97 % 06/06/2017  1:01 PM  Vitals shown include unvalidated device data.  Last Pain:  Vitals:   06/06/17 1021  TempSrc: Oral      Patients Stated Pain Goal: 5 (61/22/44 9753)  Complications: No apparent anesthesia complications

## 2017-06-07 ENCOUNTER — Encounter (HOSPITAL_BASED_OUTPATIENT_CLINIC_OR_DEPARTMENT_OTHER): Payer: Self-pay | Admitting: Urology

## 2017-06-12 DIAGNOSIS — C678 Malignant neoplasm of overlapping sites of bladder: Secondary | ICD-10-CM | POA: Diagnosis not present

## 2017-07-23 ENCOUNTER — Other Ambulatory Visit: Payer: Self-pay | Admitting: Internal Medicine

## 2017-07-23 DIAGNOSIS — R413 Other amnesia: Secondary | ICD-10-CM

## 2017-07-23 DIAGNOSIS — R197 Diarrhea, unspecified: Secondary | ICD-10-CM | POA: Diagnosis not present

## 2017-07-23 DIAGNOSIS — E11319 Type 2 diabetes mellitus with unspecified diabetic retinopathy without macular edema: Secondary | ICD-10-CM | POA: Diagnosis not present

## 2017-07-23 DIAGNOSIS — I10 Essential (primary) hypertension: Secondary | ICD-10-CM | POA: Diagnosis not present

## 2017-07-25 DIAGNOSIS — C678 Malignant neoplasm of overlapping sites of bladder: Secondary | ICD-10-CM | POA: Diagnosis not present

## 2017-07-25 DIAGNOSIS — Z5111 Encounter for antineoplastic chemotherapy: Secondary | ICD-10-CM | POA: Diagnosis not present

## 2017-08-01 ENCOUNTER — Encounter: Payer: Self-pay | Admitting: Gastroenterology

## 2017-08-01 DIAGNOSIS — R8271 Bacteriuria: Secondary | ICD-10-CM | POA: Diagnosis not present

## 2017-08-01 DIAGNOSIS — C678 Malignant neoplasm of overlapping sites of bladder: Secondary | ICD-10-CM | POA: Diagnosis not present

## 2017-08-07 ENCOUNTER — Ambulatory Visit
Admission: RE | Admit: 2017-08-07 | Discharge: 2017-08-07 | Disposition: A | Discharge status: Home / Self Care | Payer: Medicare Other | Source: Ambulatory Visit | Attending: Internal Medicine | Admitting: Internal Medicine

## 2017-08-07 DIAGNOSIS — R410 Disorientation, unspecified: Secondary | ICD-10-CM | POA: Diagnosis not present

## 2017-08-07 DIAGNOSIS — R413 Other amnesia: Secondary | ICD-10-CM | POA: Diagnosis not present

## 2017-08-22 ENCOUNTER — Ambulatory Visit: Payer: Self-pay | Admitting: Gastroenterology

## 2017-08-23 DIAGNOSIS — Z5111 Encounter for antineoplastic chemotherapy: Secondary | ICD-10-CM | POA: Diagnosis not present

## 2017-08-23 DIAGNOSIS — C678 Malignant neoplasm of overlapping sites of bladder: Secondary | ICD-10-CM | POA: Diagnosis not present

## 2017-08-28 ENCOUNTER — Encounter: Payer: Self-pay | Admitting: Gastroenterology

## 2017-08-28 ENCOUNTER — Ambulatory Visit (INDEPENDENT_AMBULATORY_CARE_PROVIDER_SITE_OTHER): Payer: Medicare Other | Admitting: Gastroenterology

## 2017-08-28 VITALS — BP 140/82 | HR 72 | Ht 72.0 in | Wt 216.0 lb

## 2017-08-28 DIAGNOSIS — R194 Change in bowel habit: Secondary | ICD-10-CM | POA: Diagnosis not present

## 2017-08-28 NOTE — Patient Instructions (Signed)
Start Benefiber once daily.

## 2017-08-28 NOTE — Progress Notes (Signed)
08/28/2017 Steven Kline [id number] 1941-02-10   HISTORY OF PRESENT ILLNESS:  This is a 77 year old male who is a patient of Dr. Eugenia Pancoast.  He comes here today with his Daughter, [name], with complaints of a change in bowel habits for the past few months.  Really only has increased frequency.  Consistency of stool is normal, however.  Says that they are occasionally loose but mostly normal, formed consistency.  He's had a couple of episodes of incontinence, but that does not occur regularly.  Seems to have started around the time of his BCG bladder injections for his bladder cancer.  They are asking if that could be related.  Last colonoscopy was in 02/2012 with Dr. Ardis Hughs at which time the study was found to have multiple diverticula and one small polyp removed that was benign polypoid colorectal mucosa.  Family is concerned because it is affecting his life and his ability to get out of the house.  No rectal bleeding.  No abdominal pain.  Is getting up at night on occasion to have a BM, but once again, not diarrhea.     Past Medical History:  Diagnosis Date  . Baker's cyst of knee   . Frequency of urination   . Hematuria   . History of gastric ulcer    s/p  exp. lap.  repair perforated gastric ulcer  . History of stress test    per pt had test done this year 2018 approx. in spring or early summer-- pt does not remember who or where  . Hyperlipidemia   . Hypertension   . Nocturia   . OSA (obstructive sleep apnea)    per pt no longer using cpap---  remote hx sleep apnea dx  . Type 2 diabetes mellitus ([place])    Past Surgical History:  Procedure Laterality Date  . COLONOSCOPY  last one 03-01-2012  . INGUINAL HERNIA REPAIR Left 1960  . REPAIR OF PERFORATED ULCER  09-14-2015   at [facility], MD   via explor. lap.  . TRANSURETHRAL RESECTION OF BLADDER TUMOR N/A 11/06/2016   Procedure: TRANSURETHRAL RESECTION OF BLADDER TUMOR (TURBT), MEATAL DILITATION;  Surgeon: [name], MD;   Location: [facility];  Service: Urology;  Laterality: N/A;  . TRANSURETHRAL RESECTION OF BLADDER TUMOR N/A 06/06/2017   Procedure: TRANSURETHRAL RESECTION OF BLADDER TUMOR (TURBT);  Surgeon: [name], MD;  Location: [facility];  Service: Urology;  Laterality: N/A;  . UVULOPALATOPHARYNGOPLASTY  yrs ago (date?)   [facility]    reports that he has been smoking cigars.  He has smoked for the past 63.00 years. He has never used smokeless tobacco. He reports that he does not drink alcohol or use drugs. family history includes Lung cancer in his mother. No Known Allergies    Outpatient Encounter Medications as of 08/28/2017  Medication Sig  . glyBURIDE-metformin (GLUCOVANCE) 2.5-500 MG per tablet Take 1 tablet by mouth at bedtime. Takes 1 tablet in am and 2 tablets at bedtime  . lisinopril (PRINIVIL,ZESTRIL) 20 MG tablet Take 20 mg by mouth every morning.   . pravastatin (PRAVACHOL) 80 MG tablet Take 80 mg by mouth at bedtime.   . [DISCONTINUED] cephALEXin (KEFLEX) 500 MG capsule Take 1 capsule (500 mg total) by mouth 3 (three) times daily.  . [DISCONTINUED] HYDROcodone-acetaminophen (NORCO/VICODIN) 5-325 MG tablet Take 1 tablet by mouth every 4 (four) hours as needed for moderate pain.   No facility-administered encounter medications on file as  of 08/28/2017.      REVIEW OF SYSTEMS  : All other systems reviewed and negative except where noted in the History of Present Illness.   PHYSICAL EXAM: BP 140/82   Pulse 72   Ht 6' (1.829 m)   Wt 216 lb (98 kg)   BMI 29.29 kg/m  General: Well developed white male in no acute distress Head: Normocephalic and atraumatic Eyes:  Sclerae anicteric, conjunctiva pink. Ears: Normal auditory acuity Lungs: Clear throughout to auscultation; no increased WOB. Heart: Regular rate and rhythm; no M/R/G. Abdomen: Soft, non-distended.  BS present.  Non-tender. Rectal:  Will be done at the time of colonoscopy. Musculoskeletal:  Symmetrical with no gross deformities  Skin: No lesions on visible extremities Extremities: No edema  Neurological: Alert oriented x 4, grossly non-focal Psychological:  Alert and cooperative. Normal mood and affect  ASSESSMENT AND PLAN: *77 year old male with a change in bowel habits.  Really only has increased frequency.  Consistency of stool is normal, however.  He's had a couple of episodes of incontinence, but that does not occur regularly.  Seems to have started around the time of his BCG bladder injections for his bladder cancer.  ? If that is related.  Last colonoscopy was 5.5 years ago.  Family is concerned because it is affecting his life and his ability to get out of the house.  Will schedule colonoscopy with Dr. Ardis Hughs.  Will have him start a daily powder fiber supplement such as Benefiber or Citrucel.  **The risks, benefits, and alternatives to colonoscopy were discussed with the patient and he consents to proceed.    CC:  Deland Pretty, MD

## 2017-08-28 NOTE — Progress Notes (Signed)
I agree with the above note, plan 

## 2017-08-29 DIAGNOSIS — Z5111 Encounter for antineoplastic chemotherapy: Secondary | ICD-10-CM | POA: Diagnosis not present

## 2017-08-29 DIAGNOSIS — C678 Malignant neoplasm of overlapping sites of bladder: Secondary | ICD-10-CM | POA: Diagnosis not present

## 2017-09-04 ENCOUNTER — Telehealth: Payer: Self-pay | Admitting: Gastroenterology

## 2017-09-04 DIAGNOSIS — R197 Diarrhea, unspecified: Secondary | ICD-10-CM

## 2017-09-04 NOTE — Telephone Encounter (Signed)
Ok to check stool for Cdiff PCR.

## 2017-09-04 NOTE — Telephone Encounter (Signed)
Order for C diff entered into Epic pt daughter aware  She will pick kit up today or tomorrow and we will call when results are reviewed.

## 2017-09-04 NOTE — Telephone Encounter (Signed)
The pt's daughter states that the pt's diarrhea has gotten worse and also states he has not started fiber.  She does mention that he was on an "antibiotic for his bladder issue"  A few weeks ago.  She wants to know if he needs a stool sample to check for C diff.  I did advise her I would pass that question on to [facility] and advised that after she hears back from me regarding the stool test he needs to start fiber and get scheduled for colon if negative.  Please advise

## 2017-09-12 DIAGNOSIS — Z5111 Encounter for antineoplastic chemotherapy: Secondary | ICD-10-CM | POA: Diagnosis not present

## 2017-09-12 DIAGNOSIS — C678 Malignant neoplasm of overlapping sites of bladder: Secondary | ICD-10-CM | POA: Diagnosis not present

## 2017-09-19 DIAGNOSIS — C678 Malignant neoplasm of overlapping sites of bladder: Secondary | ICD-10-CM | POA: Diagnosis not present

## 2017-09-25 ENCOUNTER — Encounter: Payer: Self-pay | Admitting: *Deleted

## 2017-09-26 ENCOUNTER — Encounter

## 2017-09-26 ENCOUNTER — Ambulatory Visit (INDEPENDENT_AMBULATORY_CARE_PROVIDER_SITE_OTHER): Payer: Medicare Other | Admitting: Diagnostic Neuroimaging

## 2017-09-26 ENCOUNTER — Encounter: Payer: Self-pay | Admitting: Diagnostic Neuroimaging

## 2017-09-26 VITALS — BP 117/63 | HR 71 | Ht 72.0 in | Wt 215.8 lb

## 2017-09-26 DIAGNOSIS — I63412 Cerebral infarction due to embolism of left middle cerebral artery: Secondary | ICD-10-CM | POA: Diagnosis not present

## 2017-09-26 DIAGNOSIS — F03A0 Unspecified dementia, mild, without behavioral disturbance, psychotic disturbance, mood disturbance, and anxiety: Secondary | ICD-10-CM

## 2017-09-26 DIAGNOSIS — F0390 Unspecified dementia without behavioral disturbance: Secondary | ICD-10-CM

## 2017-09-26 DIAGNOSIS — C678 Malignant neoplasm of overlapping sites of bladder: Secondary | ICD-10-CM | POA: Diagnosis not present

## 2017-09-26 NOTE — Progress Notes (Signed)
[facility]  PATIENT: Steven Kline DOB: 07/20/1940  REFERRING CLINICIAN: Audie Pinto HISTORY FROM: patient  REASON FOR VISIT: new consult    HISTORICAL  CHIEF COMPLAINT:  Chief Complaint  Patient presents with  . NP  Dr. Shelia Media  . Memory Loss    forgetfulness, OCD (new) personality traits that not WNL. Rm 6, daughter Steven Kline     HISTORY OF PRESENT ILLNESS:   77 year old left-handed, converted to right-handed, male with history of diabetes, hypertension, hyperlipidemia, GI bleeding gastric ulcer, paroxysmal atrial fibrillation, here for evaluation of memory loss.  Patient is tangential during our evaluation.  He is not sure why he was referred here.  When asked about memory, he acknowledges some mild memory problems.  He proceeds to tell me about his biological father who had Alzheimer's dementia.  Patient is not that concerned about his memory loss.  He lives alone.  He drives.  He was recently forced into retirement in January 2019 due to job performance issues and memory problems.  He takes care of most of his activities of daily living himself.  He has support from his family, daughters, grandchildren who live nearby.    According to patient's daughter, for past 1 to 2 years patient has been having progressive short-term memory loss and confusion. He has had some trouble with driving directions, forgot how to drive his manual transmission car, and having confusion typically at nighttime.  He tends to repeat himself.  Sometimes has difficulty with sleep.  Also having more problems with emotional regulation, short temper, perseveration, mainly noted by family.   REVIEW OF SYSTEMS: Full 14 system review of systems performed and negative with exception of: Insomnia snoring memory loss confusion hearing loss incontinence.  ALLERGIES: No Known Allergies  HOME MEDICATIONS: Outpatient Medications Prior to Visit  Medication Sig Dispense Refill  . glyBURIDE-metformin  (GLUCOVANCE) 2.5-500 MG per tablet Take 1 tablet by mouth at bedtime. Takes 1 tablet in am and 2 tablets at bedtime    . lisinopril (PRINIVIL,ZESTRIL) 20 MG tablet Take 20 mg by mouth every morning.     . pravastatin (PRAVACHOL) 80 MG tablet Take 80 mg by mouth at bedtime.     . Wheat Dextrin (BENEFIBER PO) Take by mouth as needed.     No facility-administered medications prior to visit.     PAST MEDICAL HISTORY: Past Medical History:  Diagnosis Date  . Atrial fibrillation ([place])   . Baker's cyst of knee   . Frequency of urination   . Hematuria   . History of gastric ulcer    s/p  exp. lap.  repair perforated gastric ulcer  . History of stress test    per pt had test done this year 2018 approx. in spring or early summer-- pt does not remember who or where  . Hyperlipidemia   . Hypertension   . Nocturia   . OSA (obstructive sleep apnea)    per pt no longer using cpap---  remote hx sleep apnea dx  . Type 2 diabetes mellitus ([place])     PAST SURGICAL HISTORY: Past Surgical History:  Procedure Laterality Date  . COLONOSCOPY  last one 03-01-2012  . INGUINAL HERNIA REPAIR Left 1960  . REPAIR OF PERFORATED ULCER  09-14-2015   at [facility], [place]   via explor. lap.  . TRANSURETHRAL RESECTION OF BLADDER TUMOR N/A 11/06/2016   Procedure: TRANSURETHRAL RESECTION OF BLADDER TUMOR (TURBT), MEATAL DILITATION;  Surgeon: [name], MD;  Location: [facility];  Service: Urology;  Laterality: N/A;  . TRANSURETHRAL RESECTION OF BLADDER TUMOR N/A 06/06/2017   Procedure: TRANSURETHRAL RESECTION OF BLADDER TUMOR (TURBT);  Surgeon: [name], MD;  Location: [facility];  Service: Urology;  Laterality: N/A;  . UVULOPALATOPHARYNGOPLASTY  yrs ago (date?)   [facility]    FAMILY HISTORY: Family History  Problem Relation Age of Onset  . Lung cancer Mother        smoker  . Cancer Father   . Heart Problems Father   . Dementia Father   . Stroke Sister   . Colon  cancer Neg Hx   . Stomach cancer Neg Hx     SOCIAL HISTORY:  Social History   Socioeconomic History  . Marital status: Widowed    Spouse name: Not on file  . Number of children: 3  . Years of education: Not on file  . Highest education level: Not on file  Occupational History  . Occupation: retired  Scientific laboratory technician  . Financial resource strain: Not on file  . Food insecurity:    Worry: Not on file    Inability: Not on file  . Transportation needs:    Medical: Not on file    Non-medical: Not on file  Tobacco Use  . Smoking status: Current Every Day Smoker    Years: 63.00    Types: Cigars  . Smokeless tobacco: Never Used  . Tobacco comment: smokes 6-7 cigars a day  Substance and Sexual Activity  . Alcohol use: No  . Drug use: No  . Sexual activity: Not on file  Lifestyle  . Physical activity:    Days per week: Not on file    Minutes per session: Not on file  . Stress: Not on file  Relationships  . Social connections:    Talks on phone: Not on file    Gets together: Not on file    Attends religious service: Not on file    Active member of club or organization: Not on file    Attends meetings of clubs or organizations: Not on file    Relationship status: Not on file  . Intimate partner violence:    Fear of current or ex partner: Not on file    Emotionally abused: Not on file    Physically abused: Not on file    Forced sexual activity: Not on file  Other Topics Concern  . Not on file  Social History Narrative   Lives alone widowed.   Retired.  Education HS.  Children 3.  Steven Kline and Santiago Glad daughters on [place].  Caffeine 1-2 cups daily.     PHYSICAL EXAM  GENERAL EXAM/CONSTITUTIONAL: Vitals:  Vitals:   09/26/17 1016  BP: 117/63  Pulse: 71  Weight: 215 lb 12.8 oz (97.9 kg)  Height: 6' (1.829 m)     Body mass index is 29.27 kg/m.  No exam data present  Patient is in no distress; well developed, nourished and groomed; neck is  supple  CARDIOVASCULAR:  Examination of carotid arteries is normal; no carotid bruits  Regular rate and rhythm, no murmurs  Examination of peripheral vascular system by observation and palpation is normal  EYES:  Ophthalmoscopic exam of optic discs and posterior segments is normal; no papilledema or hemorrhages  MUSCULOSKELETAL:  Gait, strength, tone, movements noted in Neurologic exam below  NEUROLOGIC: MENTAL STATUS:  MMSE - Mini Mental State Exam 09/26/2017  Orientation to time 5  Orientation to Place 4  Registration 3  Attention/ Calculation 1  Recall 3  Language- name 2 objects 2  Language- repeat 1  Language- follow 3 step command 2  Language- read & follow direction 1  Write a sentence 1  Copy design 1  Total score 24    awake, alert, oriented to person, place and time  recent and remote memory intact  normal attention and concentration  language fluent, comprehension intact, naming intact,   fund of knowledge appropriate  PERSEVERATES ON SOME TOPICS, THEN TANGENTIAL  CRANIAL NERVE:   2nd - no papilledema on fundoscopic exam  2nd, 3rd, 4th, 6th - pupils equal and reactive to light, visual fields full to confrontation, extraocular muscles intact, no nystagmus  5th - facial sensation symmetric  7th - facial strength symmetric  8th - hearing intact  9th - palate elevates symmetrically, uvula midline  11th - shoulder shrug symmetric  12th - tongue protrusion midline  MOTOR:   normal bulk and tone, full strength in the BUE, BLE  SENSORY:   normal and symmetric to light touch, temperature, vibration  COORDINATION:   finger-nose-finger, fine finger movements normal  REFLEXES:   deep tendon reflexes TRACE and symmetric  POSITIVE PALMOMENTAL REFLEXES  GAIT/STATION:   narrow based gait; romberg is negative    DIAGNOSTIC DATA (LABS, IMAGING, TESTING) - I reviewed patient records, labs, notes, testing and imaging myself where  available.  Lab Results  Component Value Date   WBC 9.4 10/03/2015   HGB 14.6 06/06/2017   HCT 43.0 06/06/2017   MCV 82.4 10/03/2015   PLT 326 10/03/2015      Component Value Date/Time   NA 142 06/06/2017 1104   K 4.4 06/06/2017 1104   CL 108 11/06/2016 0642   CO2 23 10/03/2015 1959   GLUCOSE 122 (H) 06/06/2017 1104   BUN 20 11/06/2016 0642   CREATININE 1.20 11/06/2016 0642   CALCIUM 8.5 (L) 10/03/2015 1959   GFRNONAA 41 (L) 10/03/2015 1959   GFRAA 48 (L) 10/03/2015 1959   No results found for: CHOL, HDL, LDLCALC, LDLDIRECT, TRIG, CHOLHDL No results found for: HGBA1C No results found for: VITAMINB12 No results found for: TSH   08/07/17 MRI brain [I reviewed images myself and agree with interpretation. -VRP]  1. No acute intracranial abnormality identified. 2. Moderate chronic microvascular ischemic changes and parenchymal volume loss of the brain. 3. Several small chronic infarctions are present within the left posterior MCA distribution and few additional small chronic infarcts are present in bilateral frontal white matter and the right cerebellar hemisphere. 4. Multiple foci of chronic microhemorrhage are present in the left-greater-than-right cerebral hemispheres in a predominantly peripheral distribution suggesting amyloid angiopathy with differential of chronic hypertension.     ASSESSMENT AND PLAN  77 y.o. year old male here with progressive short-term memory loss, confusion, repeating himself, mood lability, perseveration, and slight decline in activities of daily living including driving and paying bills.  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  Of note patient had transient atrial fibrillation in 2016 in the setting of severe GI bleeding.    Ddx: mild dementia (alzheimer's vs vascular)  1. Mild dementia   2. Cerebrovascular accident (CVA) due  to embolism of left middle cerebral artery ([place])      PLAN:  MILD DEMENTIA - safety and supervision reviewed with patient and daughter - caution with driving, finances, living alone - may consider donepezil + memantine in  future; after living situation and safety issues are addressed   STROKE PREVENTION - start aspirin 81mg  daily (if safe from GI standpoint; h/o severe GI bleeding ulcers 2016) - check carotid u/s and echocardiogram  ATRIAL FIBRILLATION (2016, transient during GI bleeding hospitalization) - follow up with cardiology for evaluation  Orders Placed This Encounter  Procedures  . ECHOCARDIOGRAM COMPLETE   Return in about 6 months (around 03/29/2018).  I reviewed images, labs, notes, records myself. I summarized findings and reviewed with patient, for this high risk condition (dementia, stroke) requiring high complexity decision making.    Penni Bombard, MD 9/87/2158, 72:76 AM Certified in Neurology, Neurophysiology and Neuroimaging  [facility] [address], [place],  [address] [phone]

## 2017-09-26 NOTE — Patient Instructions (Addendum)
Thank you for coming to see [place] at [facility]. I hope we have been able to provide you high quality care today.  You may receive a patient satisfaction survey over the next few weeks. We would appreciate your feedback and comments so that we may continue to improve ourselves and the health of our patients.  MILD DEMENTIA - safety and supervision reviewed - caution with driving, finances, living alone - may consider donepezil + memantine in future; after living situation and safety issues are addressed   STROKE PREVENTION - start aspirin 4m daily (if safe from GI standpoint; h/o severe GI bleeding ulcers 2016) - check carotid u/s and echocardiogram  ATRIAL FIBRILLATION (2016, transient during GI bleeding hospitalization) - follow up with cardiology   ~~~~~~~~~~~~~~~~~~~~~~~~~~~~~~~~~~~~~~~~~~~~~~~~~~~~~~~~~~~~~~~~~  DR. Alvis Edgell'S GUIDE TO HAPPY AND HEALTHY LIVING These are some of my general health and wellness recommendations. Some of them may apply to you better than others. Please use common sense as you try these suggestions and feel free to ask me any questions.   ACTIVITY/FITNESS Mental, social, emotional and physical stimulation are very important for brain and body health. Try learning a new activity (arts, music, language, sports, games).  Keep moving your body to the best of your abilities. You can do this at home, inside or outside, the park, community center, gym or anywhere you like. Consider a physical therapist or personal trainer to get started. Fitness trackers, smart-watches or  smart-phones can help as well.   NUTRITION Eat more plants: colorful vegetables, nuts, seeds and berries.  Eat less sugar, salt, preservatives and processed foods.  Avoid toxins such as cigarettes and alcohol.  Drink water when you are thirsty. Warm water with a slice of lemon is an excellent morning drink to start the day.  Consider these websites for more  information The Nutrition Source (hhttps://www.henry-hernandez.biz/ [employer] (wWindowBlog.ch   RELAXATION Consider practicing mindfulness meditation or other relaxation techniques such as deep breathing, prayer, yoga, tai chi, massage. See website mindful.org or the apps Headspace or Calm to help get started.   SLEEP Try to get at least 7-8+ hours sleep per day. Regular exercise and reduced caffeine will help you sleep better. Practice good sleep hygeine techniques. See website sleep.org for more information.   PLANNING Prepare estate planning, living will, healthcare POA documents. Sometimes this is best planned with the help of an attorney. Theconversationproject.org and agingwithdignity.org are excellent resources.

## 2017-09-28 ENCOUNTER — Encounter: Payer: Self-pay | Admitting: Gastroenterology

## 2017-10-04 DIAGNOSIS — E11319 Type 2 diabetes mellitus with unspecified diabetic retinopathy without macular edema: Secondary | ICD-10-CM | POA: Diagnosis not present

## 2017-10-04 DIAGNOSIS — I10 Essential (primary) hypertension: Secondary | ICD-10-CM | POA: Diagnosis not present

## 2017-10-19 ENCOUNTER — Encounter: Payer: Self-pay | Admitting: Gastroenterology

## 2017-10-23 ENCOUNTER — Ambulatory Visit (HOSPITAL_COMMUNITY)
Admission: RE | Admit: 2017-10-23 | Payer: Medicare Other | Source: Ambulatory Visit | Attending: Diagnostic Neuroimaging | Admitting: Diagnostic Neuroimaging

## 2017-10-29 ENCOUNTER — Other Ambulatory Visit (HOSPITAL_COMMUNITY): Payer: Self-pay

## 2017-10-31 ENCOUNTER — Other Ambulatory Visit (HOSPITAL_COMMUNITY): Payer: Self-pay | Admitting: Internal Medicine

## 2017-10-31 DIAGNOSIS — I6523 Occlusion and stenosis of bilateral carotid arteries: Secondary | ICD-10-CM

## 2017-11-19 ENCOUNTER — Other Ambulatory Visit (HOSPITAL_COMMUNITY): Payer: Self-pay

## 2017-11-19 DIAGNOSIS — E11319 Type 2 diabetes mellitus with unspecified diabetic retinopathy without macular edema: Secondary | ICD-10-CM | POA: Diagnosis not present

## 2017-11-26 ENCOUNTER — Ambulatory Visit (HOSPITAL_COMMUNITY)
Admission: RE | Admit: 2017-11-26 | Discharge: 2017-11-26 | Disposition: A | Discharge status: Home / Self Care | Payer: Medicare Other | Source: Ambulatory Visit | Attending: Internal Medicine | Admitting: Internal Medicine

## 2017-11-26 DIAGNOSIS — I6523 Occlusion and stenosis of bilateral carotid arteries: Secondary | ICD-10-CM

## 2017-11-28 ENCOUNTER — Encounter: Payer: Self-pay | Admitting: Gastroenterology

## 2017-12-03 ENCOUNTER — Ambulatory Visit (HOSPITAL_COMMUNITY): Payer: Medicare Other | Attending: Cardiovascular Disease

## 2017-12-03 ENCOUNTER — Other Ambulatory Visit: Payer: Self-pay

## 2017-12-03 DIAGNOSIS — I10 Essential (primary) hypertension: Secondary | ICD-10-CM | POA: Insufficient documentation

## 2017-12-03 DIAGNOSIS — I088 Other rheumatic multiple valve diseases: Secondary | ICD-10-CM | POA: Diagnosis not present

## 2017-12-03 DIAGNOSIS — I63412 Cerebral infarction due to embolism of left middle cerebral artery: Secondary | ICD-10-CM | POA: Insufficient documentation

## 2017-12-03 DIAGNOSIS — E119 Type 2 diabetes mellitus without complications: Secondary | ICD-10-CM | POA: Insufficient documentation

## 2017-12-03 DIAGNOSIS — F0390 Unspecified dementia without behavioral disturbance: Secondary | ICD-10-CM

## 2017-12-03 DIAGNOSIS — I4891 Unspecified atrial fibrillation: Secondary | ICD-10-CM | POA: Diagnosis not present

## 2017-12-03 DIAGNOSIS — G4733 Obstructive sleep apnea (adult) (pediatric): Secondary | ICD-10-CM | POA: Diagnosis not present

## 2017-12-03 DIAGNOSIS — E785 Hyperlipidemia, unspecified: Secondary | ICD-10-CM | POA: Diagnosis not present

## 2017-12-03 DIAGNOSIS — F03A0 Unspecified dementia, mild, without behavioral disturbance, psychotic disturbance, mood disturbance, and anxiety: Secondary | ICD-10-CM

## 2017-12-24 DIAGNOSIS — C678 Malignant neoplasm of overlapping sites of bladder: Secondary | ICD-10-CM | POA: Diagnosis not present

## 2017-12-31 DIAGNOSIS — C678 Malignant neoplasm of overlapping sites of bladder: Secondary | ICD-10-CM | POA: Diagnosis not present

## 2018-02-26 DIAGNOSIS — H52221 Regular astigmatism, right eye: Secondary | ICD-10-CM | POA: Diagnosis not present

## 2018-02-26 DIAGNOSIS — E113293 Type 2 diabetes mellitus with mild nonproliferative diabetic retinopathy without macular edema, bilateral: Secondary | ICD-10-CM | POA: Diagnosis not present

## 2018-02-26 DIAGNOSIS — H25813 Combined forms of age-related cataract, bilateral: Secondary | ICD-10-CM | POA: Diagnosis not present

## 2018-02-26 DIAGNOSIS — H524 Presbyopia: Secondary | ICD-10-CM | POA: Diagnosis not present

## 2018-02-26 DIAGNOSIS — H43811 Vitreous degeneration, right eye: Secondary | ICD-10-CM | POA: Diagnosis not present

## 2018-04-02 ENCOUNTER — Encounter: Payer: Self-pay | Admitting: Diagnostic Neuroimaging

## 2018-04-02 ENCOUNTER — Ambulatory Visit (INDEPENDENT_AMBULATORY_CARE_PROVIDER_SITE_OTHER): Payer: Medicare Other | Admitting: Diagnostic Neuroimaging

## 2018-04-02 VITALS — BP 115/66 | HR 59 | Ht 72.0 in | Wt 209.6 lb

## 2018-04-02 DIAGNOSIS — F0390 Unspecified dementia without behavioral disturbance: Secondary | ICD-10-CM

## 2018-04-02 DIAGNOSIS — I63412 Cerebral infarction due to embolism of left middle cerebral artery: Secondary | ICD-10-CM

## 2018-04-02 DIAGNOSIS — F03A0 Unspecified dementia, mild, without behavioral disturbance, psychotic disturbance, mood disturbance, and anxiety: Secondary | ICD-10-CM

## 2018-04-02 NOTE — Progress Notes (Signed)
[facility]  PATIENT: Steven Kline DOB: 1940-04-23  REFERRING CLINICIAN: Audie Pinto HISTORY FROM: patient and daughter REASON FOR VISIT: follow up   HISTORICAL  CHIEF COMPLAINT:  Chief Complaint  Patient presents with  . Follow-up    Rm 7, daughter  . Dementia    Pt/daughter feels like he is worse,  is driving.  MMSE 24/30.      HISTORY OF PRESENT ILLNESS:   UPDATE (04/02/18, VRP): Since last visit, doing worse with memory loss. Symptoms are are progressive. Severity is mild - moderate. No alleviating or aggravating factors. Tolerating meds.    PRIOR HPI (09/26/17): 78 year old left-handed, converted to right-handed, male with history of diabetes, hypertension, hyperlipidemia, GI bleeding gastric ulcer, paroxysmal atrial fibrillation, here for evaluation of memory loss.  Patient is tangential during our evaluation.  He is not sure why he was referred here.  When asked about memory, he acknowledges some mild memory problems.  He proceeds to tell me about his biological father who had Alzheimer's dementia.  Patient is not that concerned about his memory loss.  He lives alone.  He drives.  He was recently forced into retirement in January 2019 due to job performance issues and memory problems.  He takes care of most of his activities of daily living himself.  He has support from his family, daughters, grandchildren who live nearby.    According to patient's daughter, for past 1 to 2 years patient has been having progressive short-term memory loss and confusion. He has had some trouble with driving directions, forgot how to drive his manual transmission car, and having confusion typically at nighttime.  He tends to repeat himself.  Sometimes has difficulty with sleep.  Also having more problems with emotional regulation, short temper, perseveration, mainly noted by family.   REVIEW OF SYSTEMS: Full 14 system review of systems performed and negative with exception of:  memory loss SOB.   ALLERGIES: No Known Allergies  HOME MEDICATIONS: Outpatient Medications Prior to Visit  Medication Sig Dispense Refill  . glyBURIDE-metformin (GLUCOVANCE) 2.5-500 MG per tablet Take 1 tablet by mouth at bedtime. Takes 1 tablet at bedtime.    Marland Kitchen lisinopril (PRINIVIL,ZESTRIL) 20 MG tablet Take 20 mg by mouth every morning.     . metFORMIN (GLUCOPHAGE) 1000 MG tablet Take 1,000 mg by mouth at bedtime. with food    . pravastatin (PRAVACHOL) 80 MG tablet Take 80 mg by mouth at bedtime.     . Wheat Dextrin (BENEFIBER PO) Take by mouth as needed.     No facility-administered medications prior to visit.     PAST MEDICAL HISTORY: Past Medical History:  Diagnosis Date  . Atrial fibrillation ([place])   . Baker's cyst of knee   . Bladder cancer ([place])    history of (refused preventative treatments)  . Frequency of urination   . Hematuria   . History of gastric ulcer    s/p  exp. lap.  repair perforated gastric ulcer  . History of stress test    per pt had test done this year 2018 approx. in spring or early summer-- pt does not remember who or where  . Hyperlipidemia   . Hypertension   . Nocturia   . OSA (obstructive sleep apnea)    per pt no longer using cpap---  remote hx sleep apnea dx  . Type 2 diabetes mellitus ([place])     PAST SURGICAL HISTORY: Past Surgical History:  Procedure Laterality Date  . COLONOSCOPY  last one 03-01-2012  . INGUINAL HERNIA REPAIR Left 1960  . REPAIR OF PERFORATED ULCER  09-14-2015   at [facility], [place]   via explor. lap.  . TRANSURETHRAL RESECTION OF BLADDER TUMOR N/A 11/06/2016   Procedure: TRANSURETHRAL RESECTION OF BLADDER TUMOR (TURBT), MEATAL DILITATION;  Surgeon: [name], MD;  Location: [facility];  Service: Urology;  Laterality: N/A;  . TRANSURETHRAL RESECTION OF BLADDER TUMOR N/A 06/06/2017   Procedure: TRANSURETHRAL RESECTION OF BLADDER TUMOR (TURBT);  Surgeon: [name], MD;  Location: [facility];  Service: Urology;  Laterality: N/A;  . UVULOPALATOPHARYNGOPLASTY  yrs ago (date?)   [facility]    FAMILY HISTORY: Family History  Problem Relation Age of Onset  . Lung cancer Mother        smoker  . Cancer Father   . Heart Problems Father   . Dementia Father   . Stroke Sister   . Colon cancer Neg Hx   . Stomach cancer Neg Hx     SOCIAL HISTORY:  Social History   Socioeconomic History  . Marital status: Widowed    Spouse name: Not on file  . Number of children: 3  . Years of education: Not on file  . Highest education level: Not on file  Occupational History  . Occupation: retired  Scientific laboratory technician  . Financial resource strain: Not on file  . Food insecurity:    Worry: Not on file    Inability: Not on file  . Transportation needs:    Medical: Not on file    Non-medical: Not on file  Tobacco Use  . Smoking status: Current Every Day Smoker    Years: 63.00    Types: Cigars  . Smokeless tobacco: Never Used  . Tobacco comment: smokes 6-7 cigars a day  Substance and Sexual Activity  . Alcohol use: No  . Drug use: No  . Sexual activity: Not on file  Lifestyle  . Physical activity:    Days per week: Not on file    Minutes per session: Not on file  . Stress: Not on file  Relationships  . Social connections:    Talks on phone: Not on file    Gets together: Not on file    Attends religious service: Not on file    Active member of club or organization: Not on file    Attends meetings of clubs or organizations: Not on file    Relationship status: Not on file  . Intimate partner violence:    Fear of current or ex partner: Not on file    Emotionally abused: Not on file    Physically abused: Not on file    Forced sexual activity: Not on file  Other Topics Concern  . Not on file  Social History Narrative   Lives alone widowed.   Retired.  Education HS.  Children 3.  [name] and Santiago Glad daughters on [place].  Caffeine 1-2 cups daily.     PHYSICAL EXAM  GENERAL  EXAM/CONSTITUTIONAL: Vitals:  Vitals:   04/02/18 1142  BP: 115/66  Pulse: (!) 59  Weight: 209 lb 9.6 oz (95.1 kg)  Height: 6' (1.829 m)   Body mass index is 28.43 kg/m. No exam data present  Patient is in no distress; well developed, nourished and groomed; neck is supple  CARDIOVASCULAR:  Examination of carotid arteries is normal; no carotid bruits  Regular rate and rhythm, no murmurs  Examination of peripheral vascular system by observation and palpation  is normal  EYES:  Ophthalmoscopic exam of optic discs and posterior segments is normal; no papilledema or hemorrhages  MUSCULOSKELETAL:  Gait, strength, tone, movements noted in Neurologic exam below  NEUROLOGIC: MENTAL STATUS:  MMSE - Mini Mental State Exam 04/02/2018 09/26/2017  Orientation to time 5 5  Orientation to Place 4 4  Registration 3 3  Attention/ Calculation 1 1  Recall 2 3  Language- name 2 objects 2 2  Language- repeat 1 1  Language- follow 3 step command 3 2  Language- read & follow direction 1 1  Write a sentence 1 1  Copy design 1 1  Total score 24 24    awake, alert, oriented to person, place and time  recent and remote memory intact  normal attention and concentration  language fluent, comprehension intact, naming intact,   fund of knowledge appropriate  REPETITIVE; PERSEVERATES ON SOME TOPICS, THEN TANGENTIAL  CRANIAL NERVE:   2nd - no papilledema on fundoscopic exam  2nd, 3rd, 4th, 6th - pupils equal and reactive to light, visual fields full to confrontation, extraocular muscles intact, no nystagmus  5th - facial sensation symmetric  7th - facial strength symmetric  8th - hearing intact  9th - palate elevates symmetrically, uvula midline  11th - shoulder shrug symmetric  12th - tongue protrusion midline  MOTOR:   normal bulk and tone, full strength in the BUE, BLE  SENSORY:   normal and symmetric to light touch, temperature, vibration  COORDINATION:    finger-nose-finger, fine finger movements normal  REFLEXES:   deep tendon reflexes TRACE and symmetric  POSITIVE PALMOMENTAL REFLEXES  GAIT/STATION:   narrow based gait; romberg is negative    DIAGNOSTIC DATA (LABS, IMAGING, TESTING) - I reviewed patient records, labs, notes, testing and imaging myself where available.  Lab Results  Component Value Date   WBC 9.4 10/03/2015   HGB 14.6 06/06/2017   HCT 43.0 06/06/2017   MCV 82.4 10/03/2015   PLT 326 10/03/2015      Component Value Date/Time   NA 142 06/06/2017 1104   K 4.4 06/06/2017 1104   CL 108 11/06/2016 0642   CO2 23 10/03/2015 1959   GLUCOSE 122 (H) 06/06/2017 1104   BUN 20 11/06/2016 0642   CREATININE 1.20 11/06/2016 0642   CALCIUM 8.5 (L) 10/03/2015 1959   GFRNONAA 41 (L) 10/03/2015 1959   GFRAA 48 (L) 10/03/2015 1959   No results found for: CHOL, HDL, LDLCALC, LDLDIRECT, TRIG, CHOLHDL No results found for: HGBA1C No results found for: VITAMINB12 No results found for: TSH   08/07/17 MRI brain [I reviewed images myself and agree with interpretation. -VRP]  1. No acute intracranial abnormality identified. 2. Moderate chronic microvascular ischemic changes and parenchymal volume loss of the brain. 3. Several small chronic infarctions are present within the left posterior MCA distribution and few additional small chronic infarcts are present in bilateral frontal white matter and the right cerebellar hemisphere. 4. Multiple foci of chronic microhemorrhage are present in the left-greater-than-right cerebral hemispheres in a predominantly peripheral distribution suggesting amyloid angiopathy with differential of chronic hypertension.     ASSESSMENT AND PLAN  77 y.o. year old male here with progressive short-term memory loss, confusion, repeating himself, mood lability, perseveration, and slight decline in activities of daily living including driving and paying bills.  Signs and symptoms most consistent with  neurodegenerative dementia.  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  Of note patient had transient  atrial fibrillation in 2016 in the setting of severe GI bleeding.    Ddx: mild dementia (alzheimer's vs vascular)  1. Mild dementia ([place])   2. Cerebrovascular accident (CVA) due to embolism of left middle cerebral artery ([place])      PLAN:  MILD DEMENTIA - safety and supervision reviewed with patient and daughter - caution with driving, finances, living alone - may consider donepezil + memantine in future; after living situation and safety issues are addressed   STROKE PREVENTION - start aspirin 81mg  daily (if safe from GI standpoint; h/o severe GI bleeding ulcers 2016) - check carotid u/s and echocardiogram  ATRIAL FIBRILLATION (2016, transient during GI bleeding hospitalization) - follow up with cardiology for evaluation  Return for return to PCP, pending if symptoms worsen or fail to improve.     Penni Bombard, MD 07/13/8880, 80:03 PM Certified in Neurology, Neurophysiology and Neuroimaging  [facility] [address], [place], [place] [address] [phone]

## 2018-10-11 ENCOUNTER — Other Ambulatory Visit: Payer: Self-pay

## 2018-10-23 DIAGNOSIS — E11319 Type 2 diabetes mellitus with unspecified diabetic retinopathy without macular edema: Secondary | ICD-10-CM | POA: Diagnosis not present

## 2018-10-23 DIAGNOSIS — I119 Hypertensive heart disease without heart failure: Secondary | ICD-10-CM | POA: Diagnosis not present

## 2018-10-23 DIAGNOSIS — R413 Other amnesia: Secondary | ICD-10-CM | POA: Diagnosis not present

## 2019-02-17 DIAGNOSIS — E11319 Type 2 diabetes mellitus with unspecified diabetic retinopathy without macular edema: Secondary | ICD-10-CM | POA: Diagnosis not present

## 2019-02-17 DIAGNOSIS — I119 Hypertensive heart disease without heart failure: Secondary | ICD-10-CM | POA: Diagnosis not present

## 2019-02-17 DIAGNOSIS — R413 Other amnesia: Secondary | ICD-10-CM | POA: Diagnosis not present

## 2019-06-17 ENCOUNTER — Telehealth: Payer: Self-pay | Admitting: Diagnostic Neuroimaging

## 2019-06-17 NOTE — Telephone Encounter (Signed)
Called daughter, LVM requesting call back.

## 2019-06-17 NOTE — Telephone Encounter (Signed)
Pt's daughter Wandra [place] called wanting to know if the pt can be transferred to another provider in the facility that specializes and has more knowledge of Dementia. Please advise.

## 2019-06-23 NOTE — Telephone Encounter (Signed)
LVM #2 for [facility] requesting call back to discuss her call on 06/17/19 in further detail.

## 2019-06-24 ENCOUNTER — Encounter: Payer: Self-pay | Admitting: Diagnostic Neuroimaging

## 2019-06-24 NOTE — Telephone Encounter (Addendum)
Daughter returned call, and I requested more information. She stated that in past 10 days her father has progressed very quickly, is losing weight, not eating, screaming, cursing, hitting objects, forgets how to shower and is "mean". She wants to know what type dementia and what stage he has, is it lewy body dementia, is he having TIAs? PCP prescribed  Seroquel which made him a lot worse with behaviors. He now has 5 days/week assistance in the home for several hours, and she is hiring weekend help. She feels she'll have to put him in memory care.  She wants to make FU to discuss all her concerns, next steps. We scheduled FU on her next day off. She verbalized understanding, appreciation.

## 2019-06-24 NOTE — Telephone Encounter (Signed)
Agree. -VRP 

## 2019-07-08 DIAGNOSIS — R3915 Urgency of urination: Secondary | ICD-10-CM | POA: Diagnosis not present

## 2019-07-08 DIAGNOSIS — F329 Major depressive disorder, single episode, unspecified: Secondary | ICD-10-CM | POA: Diagnosis not present

## 2019-07-08 DIAGNOSIS — N3000 Acute cystitis without hematuria: Secondary | ICD-10-CM | POA: Diagnosis not present

## 2019-07-08 DIAGNOSIS — N390 Urinary tract infection, site not specified: Secondary | ICD-10-CM | POA: Diagnosis not present

## 2019-07-08 DIAGNOSIS — I950 Idiopathic hypotension: Secondary | ICD-10-CM | POA: Diagnosis not present

## 2019-07-15 ENCOUNTER — Ambulatory Visit (INDEPENDENT_AMBULATORY_CARE_PROVIDER_SITE_OTHER): Payer: Medicare Other | Admitting: Diagnostic Neuroimaging

## 2019-07-15 ENCOUNTER — Other Ambulatory Visit: Payer: Self-pay

## 2019-07-15 ENCOUNTER — Encounter (INDEPENDENT_AMBULATORY_CARE_PROVIDER_SITE_OTHER): Payer: Self-pay

## 2019-07-15 ENCOUNTER — Encounter: Payer: Self-pay | Admitting: *Deleted

## 2019-07-15 VITALS — BP 80/58 | HR 85 | Temp 97.0°F

## 2019-07-15 DIAGNOSIS — F0391 Unspecified dementia with behavioral disturbance: Secondary | ICD-10-CM | POA: Diagnosis not present

## 2019-07-15 DIAGNOSIS — F03B18 Unspecified dementia, moderate, with other behavioral disturbance: Secondary | ICD-10-CM

## 2019-07-15 NOTE — Progress Notes (Signed)
[facility]  PATIENT: Steven Kline DOB: 1940/12/12  REFERRING CLINICIAN: Audie Pinto HISTORY FROM: patient and daughter REASON FOR VISIT: follow up   HISTORICAL  CHIEF COMPLAINT:  Chief Complaint  Patient presents with  . Dementia    rm 6, dgtrLangley Gauss,  FU requested by [facility]  MMSE 12    HISTORY OF PRESENT ILLNESS:   UPDATE (07/15/19, VRP): Since last visit, doing poorly; more agitation, mood swings, confusion. Recently started on celexa (1 week ago).  Symptoms are progressive. Severity is moderate-severe. No alleviating or aggravating factors. Tolerating meds.    UPDATE (04/02/18, VRP): Since last visit, doing worse with memory loss. Symptoms are are progressive. Severity is mild - moderate. No alleviating or aggravating factors. Tolerating meds.    PRIOR HPI (09/26/17): 79 year old left-handed, converted to right-handed, male with history of diabetes, hypertension, hyperlipidemia, GI bleeding gastric ulcer, paroxysmal atrial fibrillation, here for evaluation of memory loss.  Patient is tangential during our evaluation.  He is not sure why he was referred here.  When asked about memory, he acknowledges some mild memory problems.  He proceeds to tell me about his biological father who had Alzheimer's dementia.  Patient is not that concerned about his memory loss.  He lives alone.  He drives.  He was recently forced into retirement in January 2019 due to job performance issues and memory problems.  He takes care of most of his activities of daily living himself.  He has support from his family, daughters, grandchildren who live nearby.    According to patient's daughter, for past 1 to 2 years patient has been having progressive short-term memory loss and confusion. He has had some trouble with driving directions, forgot how to drive his manual transmission car, and having confusion typically at nighttime.  He tends to repeat himself.  Sometimes has difficulty with sleep.   Also having more problems with emotional regulation, short temper, perseveration, mainly noted by family.   REVIEW OF SYSTEMS: Full 14 system review of systems performed and negative with exception of: as per HPI.   ALLERGIES: Allergies  Allergen Reactions  . Seroquel [Quetiapine]     "made him mean"    HOME MEDICATIONS: Outpatient Medications Prior to Visit  Medication Sig Dispense Refill  . citalopram (CELEXA) 20 MG tablet 20 mg daily.    Marland Kitchen donepezil (ARICEPT) 10 MG tablet Take 10 mg by mouth at bedtime.    . Lactobacillus (PROBIOTIC ACIDOPHILUS PO) Take 1 tablet by mouth daily.    Marland Kitchen lisinopril (PRINIVIL,ZESTRIL) 20 MG tablet Take 20 mg by mouth every morning.     . memantine (NAMENDA) 10 MG tablet Take 10 mg by mouth 2 (two) times daily.    . metFORMIN (GLUCOPHAGE) 1000 MG tablet Take 1,000 mg by mouth at bedtime. with food    . pravastatin (PRAVACHOL) 80 MG tablet Take 80 mg by mouth at bedtime.     . Wheat Dextrin (BENEFIBER PO) Take by mouth as needed.    . glyBURIDE-metformin (GLUCOVANCE) 2.5-500 MG per tablet Take 1 tablet by mouth at bedtime. Takes 1 tablet at bedtime.     No facility-administered medications prior to visit.    PAST MEDICAL HISTORY: Past Medical History:  Diagnosis Date  . Atrial fibrillation ([place])   . Baker's cyst of knee   . Bladder cancer ([place])    history of (refused preventative treatments)  . Frequency of urination   . Hematuria   . History of gastric ulcer  s/p  exp. lap.  repair perforated gastric ulcer  . History of stress test    per pt had test done this year 2018 approx. in spring or early summer-- pt does not remember who or where  . Hyperlipidemia   . Hypertension   . Nocturia   . OSA (obstructive sleep apnea)    per pt no longer using cpap---  remote hx sleep apnea dx  . Type 2 diabetes mellitus ([place])     PAST SURGICAL HISTORY: Past Surgical History:  Procedure Laterality Date  . COLONOSCOPY  last one 03-01-2012  .  INGUINAL HERNIA REPAIR Left 1960  . REPAIR OF PERFORATED ULCER  09-14-2015   at [facility], [place]   via explor. lap.  . TRANSURETHRAL RESECTION OF BLADDER TUMOR N/A 11/06/2016   Procedure: TRANSURETHRAL RESECTION OF BLADDER TUMOR (TURBT), MEATAL DILITATION;  Surgeon: [name], MD;  Location: [facility];  Service: Urology;  Laterality: N/A;  . TRANSURETHRAL RESECTION OF BLADDER TUMOR N/A 06/06/2017   Procedure: TRANSURETHRAL RESECTION OF BLADDER TUMOR (TURBT);  Surgeon: [name], MD;  Location: [facility];  Service: Urology;  Laterality: N/A;  . UVULOPALATOPHARYNGOPLASTY  yrs ago (date?)   [facility]    FAMILY HISTORY: Family History  Problem Relation Age of Onset  . Lung cancer Mother        smoker  . Cancer Father   . Heart Problems Father   . Dementia Father   . Stroke Sister   . Colon cancer Neg Hx   . Stomach cancer Neg Hx     SOCIAL HISTORY:  Social History   Socioeconomic History  . Marital status: Widowed    Spouse name: Not on file  . Number of children: 3  . Years of education: Not on file  . Highest education level: Not on file  Occupational History  . Occupation: retired  Tobacco Use  . Smoking status: Current Every Day Smoker    Years: 63.00    Types: Cigars  . Smokeless tobacco: Never Used  . Tobacco comment: smokes 6-7 cigars a day  Substance and Sexual Activity  . Alcohol use: No  . Drug use: No  . Sexual activity: Not on file  Other Topics Concern  . Not on file  Social History Narrative   Lives alone widowed.   Retired.  Education HS.  Children 3.  [name] and Santiago Glad daughters on [place].  Caffeine 1-2 cups daily.   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 Difficulty of Paying Living Expenses:   Food Insecurity:   . Worried About Charity fundraiser in the Last Year:   . Arboriculturist in the  Last Year:   Transportation Needs:   . Film/video editor (Medical):   Marland Kitchen Lack of Transportation (Non-Medical):   Physical Activity:   . Days of Exercise per Week:   . Minutes of Exercise per Session:   Stress:   . Feeling of Stress :   Social Connections:   . Frequency of Communication with Friends and Family:   . Frequency of Social Gatherings with Friends and Family:   . Attends Religious Services:   . Active Member of Clubs or Organizations:   . Attends Archivist Meetings:   Marland Kitchen Marital Status:   Intimate Partner Violence:   . Fear of  Current or Ex-Partner:   . Emotionally Abused:   Marland Kitchen Physically Abused:   . Sexually Abused:      PHYSICAL EXAM  GENERAL EXAM/CONSTITUTIONAL: Vitals:  Vitals:   07/15/19 1115  BP: (!) 80/58  Pulse: 85  Temp: (!) 97 F (36.1 C)   There is no height or weight on file to calculate BMI. No exam data present  Patient is in no distress; well developed, nourished and groomed; neck is supple  CARDIOVASCULAR:  Examination of carotid arteries is normal; no carotid bruits  Regular rate and rhythm, no murmurs  Examination of peripheral vascular system by observation and palpation is normal  EYES:  Ophthalmoscopic exam of optic discs and posterior segments is normal; no papilledema or hemorrhages  MUSCULOSKELETAL:  Gait, strength, tone, movements noted in Neurologic exam below  NEUROLOGIC: MENTAL STATUS:  MMSE - [place] Exam 07/15/2019 04/02/2018 09/26/2017  Orientation to time 1 5 5   Orientation to Place 2 4 4   Registration 2 3 3   Attention/ Calculation 0 1 1  Recall 1 2 3   Language- name 2 objects 2 2 2   Language- repeat 0 1 1  Language- follow 3 step command 3 3 2   Language- read & follow direction 1 1 1   Write a sentence 0 1 1  Copy design 0 1 1  Total score 12 24 24     awake, alert, oriented to person  recent and remote memory  decr attention and concentration  language fluent, comprehension  intact, naming intact,   fund of knowledge appropriate  REPETITIVE; PERSEVERATES ON SOME TOPICS, THEN TANGENTIAL  CRANIAL NERVE:   2nd - no papilledema on fundoscopic exam  2nd, 3rd, 4th, 6th - pupils equal and reactive to light, visual fields full to confrontation, extraocular muscles intact, no nystagmus  5th - facial sensation symmetric  7th - facial strength symmetric  8th - hearing intact  9th - palate elevates symmetrically, uvula midline  11th - shoulder shrug symmetric  12th - tongue protrusion midline  MOTOR:   normal bulk and tone, full strength in the BUE, BLE  SENSORY:   normal and symmetric to light touch, temperature, vibration  COORDINATION:   finger-nose-finger, fine finger movements normal  REFLEXES:   deep tendon reflexes TRACE and symmetric  POSITIVE PALMOMENTAL REFLEXES  GAIT/STATION:   narrow based gait    DIAGNOSTIC DATA (LABS, IMAGING, TESTING) - I reviewed patient records, labs, notes, testing and imaging myself where available.  Lab Results  Component Value Date   WBC 9.4 10/03/2015   HGB 14.6 06/06/2017   HCT 43.0 06/06/2017   MCV 82.4 10/03/2015   PLT 326 10/03/2015      Component Value Date/Time   NA 142 06/06/2017 1104   K 4.4 06/06/2017 1104   CL 108 11/06/2016 0642   CO2 23 10/03/2015 1959   GLUCOSE 122 (H) 06/06/2017 1104   BUN 20 11/06/2016 0642   CREATININE 1.20 11/06/2016 0642   CALCIUM 8.5 (L) 10/03/2015 1959   GFRNONAA 41 (L) 10/03/2015 1959   GFRAA 48 (L) 10/03/2015 1959   No results found for: CHOL, HDL, LDLCALC, LDLDIRECT, TRIG, CHOLHDL No results found for: HGBA1C No results found for: VITAMINB12 No results found for: TSH   08/07/17 MRI brain [I reviewed images myself and agree with interpretation. -VRP]  1. No acute intracranial abnormality identified. 2. Moderate chronic microvascular ischemic changes and parenchymal volume loss of the brain. 3. Several small chronic infarctions are present  within the left posterior  MCA distribution and few additional small chronic infarcts are present in bilateral frontal white matter and the right cerebellar hemisphere. 4. Multiple foci of chronic microhemorrhage are present in the left-greater-than-right cerebral hemispheres in a predominantly peripheral distribution suggesting amyloid angiopathy with differential of chronic hypertension.     ASSESSMENT AND PLAN  79 y.o. year old male here with progressive short-term memory loss, confusion, repeating himself, mood lability, perseveration, and slight decline in activities of daily living including driving and paying bills.  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  Of note patient had transient atrial fibrillation in 2016 in the setting of severe GI bleeding.    Ddx: moderate-severe dementia with behavior   1. Moderate dementia with behavioral disturbance (HCC)     PLAN:  MODERATE-SEVERE DEMENTIA WITH BEHAVIOR CHANGES - safety and supervision reviewed with patient and daughter - caution with living alone; agree with home health care aids - continue memantine, donepezil, celexa   Return for pending if symptoms worsen or fail to improve.     Penni Bombard, MD [id number], [id number] AM Certified in Neurology, Neurophysiology and Neuroimaging  [facility] [address] [place], [place] [address] [phone]

## 2019-09-12 DIAGNOSIS — Z9181 History of falling: Secondary | ICD-10-CM | POA: Diagnosis not present

## 2019-09-12 DIAGNOSIS — M1990 Unspecified osteoarthritis, unspecified site: Secondary | ICD-10-CM | POA: Diagnosis not present

## 2019-09-12 DIAGNOSIS — G309 Alzheimer's disease, unspecified: Secondary | ICD-10-CM | POA: Diagnosis not present

## 2019-09-12 DIAGNOSIS — R1310 Dysphagia, unspecified: Secondary | ICD-10-CM | POA: Diagnosis not present

## 2019-09-12 DIAGNOSIS — E785 Hyperlipidemia, unspecified: Secondary | ICD-10-CM | POA: Diagnosis not present

## 2019-09-12 DIAGNOSIS — F0280 Dementia in other diseases classified elsewhere without behavioral disturbance: Secondary | ICD-10-CM | POA: Diagnosis not present

## 2019-09-12 DIAGNOSIS — E119 Type 2 diabetes mellitus without complications: Secondary | ICD-10-CM | POA: Diagnosis not present

## 2019-09-16 DIAGNOSIS — G309 Alzheimer's disease, unspecified: Secondary | ICD-10-CM | POA: Diagnosis not present

## 2019-09-16 DIAGNOSIS — F0280 Dementia in other diseases classified elsewhere without behavioral disturbance: Secondary | ICD-10-CM | POA: Diagnosis not present

## 2019-09-16 DIAGNOSIS — E785 Hyperlipidemia, unspecified: Secondary | ICD-10-CM | POA: Diagnosis not present

## 2019-09-16 DIAGNOSIS — R1310 Dysphagia, unspecified: Secondary | ICD-10-CM | POA: Diagnosis not present

## 2019-09-16 DIAGNOSIS — E119 Type 2 diabetes mellitus without complications: Secondary | ICD-10-CM | POA: Diagnosis not present

## 2019-09-16 DIAGNOSIS — M1990 Unspecified osteoarthritis, unspecified site: Secondary | ICD-10-CM | POA: Diagnosis not present

## 2019-09-17 DIAGNOSIS — F0280 Dementia in other diseases classified elsewhere without behavioral disturbance: Secondary | ICD-10-CM | POA: Diagnosis not present

## 2019-09-17 DIAGNOSIS — E119 Type 2 diabetes mellitus without complications: Secondary | ICD-10-CM | POA: Diagnosis not present

## 2019-09-17 DIAGNOSIS — E785 Hyperlipidemia, unspecified: Secondary | ICD-10-CM | POA: Diagnosis not present

## 2019-09-17 DIAGNOSIS — R1310 Dysphagia, unspecified: Secondary | ICD-10-CM | POA: Diagnosis not present

## 2019-09-17 DIAGNOSIS — M1990 Unspecified osteoarthritis, unspecified site: Secondary | ICD-10-CM | POA: Diagnosis not present

## 2019-09-17 DIAGNOSIS — G309 Alzheimer's disease, unspecified: Secondary | ICD-10-CM | POA: Diagnosis not present

## 2019-09-18 ENCOUNTER — Emergency Department (HOSPITAL_COMMUNITY)
Admission: EM | Admit: 2019-09-18 | Discharge: 2019-09-18 | Disposition: A | Discharge status: Skilled Nursing Facility | Payer: Medicare Other | Attending: Emergency Medicine | Admitting: Emergency Medicine

## 2019-09-18 ENCOUNTER — Other Ambulatory Visit: Payer: Self-pay

## 2019-09-18 ENCOUNTER — Emergency Department (HOSPITAL_COMMUNITY): Payer: Medicare Other

## 2019-09-18 DIAGNOSIS — R410 Disorientation, unspecified: Secondary | ICD-10-CM | POA: Diagnosis not present

## 2019-09-18 DIAGNOSIS — S51811A Laceration without foreign body of right forearm, initial encounter: Secondary | ICD-10-CM | POA: Diagnosis not present

## 2019-09-18 DIAGNOSIS — Z7401 Bed confinement status: Secondary | ICD-10-CM | POA: Diagnosis not present

## 2019-09-18 DIAGNOSIS — Y929 Unspecified place or not applicable: Secondary | ICD-10-CM | POA: Insufficient documentation

## 2019-09-18 DIAGNOSIS — W19XXXA Unspecified fall, initial encounter: Secondary | ICD-10-CM

## 2019-09-18 DIAGNOSIS — I6389 Other cerebral infarction: Secondary | ICD-10-CM | POA: Diagnosis not present

## 2019-09-18 DIAGNOSIS — Z743 Need for continuous supervision: Secondary | ICD-10-CM | POA: Diagnosis not present

## 2019-09-18 DIAGNOSIS — E119 Type 2 diabetes mellitus without complications: Secondary | ICD-10-CM | POA: Diagnosis not present

## 2019-09-18 DIAGNOSIS — M1990 Unspecified osteoarthritis, unspecified site: Secondary | ICD-10-CM | POA: Diagnosis not present

## 2019-09-18 DIAGNOSIS — S199XXA Unspecified injury of neck, initial encounter: Secondary | ICD-10-CM | POA: Diagnosis not present

## 2019-09-18 DIAGNOSIS — E785 Hyperlipidemia, unspecified: Secondary | ICD-10-CM | POA: Diagnosis not present

## 2019-09-18 DIAGNOSIS — S51012A Laceration without foreign body of left elbow, initial encounter: Secondary | ICD-10-CM | POA: Diagnosis not present

## 2019-09-18 DIAGNOSIS — M4802 Spinal stenosis, cervical region: Secondary | ICD-10-CM | POA: Diagnosis not present

## 2019-09-18 DIAGNOSIS — I10 Essential (primary) hypertension: Secondary | ICD-10-CM | POA: Insufficient documentation

## 2019-09-18 DIAGNOSIS — S59902A Unspecified injury of left elbow, initial encounter: Secondary | ICD-10-CM | POA: Diagnosis present

## 2019-09-18 DIAGNOSIS — Z7984 Long term (current) use of oral hypoglycemic drugs: Secondary | ICD-10-CM | POA: Insufficient documentation

## 2019-09-18 DIAGNOSIS — F17290 Nicotine dependence, other tobacco product, uncomplicated: Secondary | ICD-10-CM | POA: Diagnosis not present

## 2019-09-18 DIAGNOSIS — Z8551 Personal history of malignant neoplasm of bladder: Secondary | ICD-10-CM | POA: Diagnosis not present

## 2019-09-18 DIAGNOSIS — F0280 Dementia in other diseases classified elsewhere without behavioral disturbance: Secondary | ICD-10-CM | POA: Diagnosis not present

## 2019-09-18 DIAGNOSIS — Y939 Activity, unspecified: Secondary | ICD-10-CM | POA: Diagnosis not present

## 2019-09-18 DIAGNOSIS — R1310 Dysphagia, unspecified: Secondary | ICD-10-CM | POA: Diagnosis not present

## 2019-09-18 DIAGNOSIS — Y999 Unspecified external cause status: Secondary | ICD-10-CM | POA: Insufficient documentation

## 2019-09-18 DIAGNOSIS — I7090 Unspecified atherosclerosis: Secondary | ICD-10-CM | POA: Diagnosis not present

## 2019-09-18 DIAGNOSIS — S0990XA Unspecified injury of head, initial encounter: Secondary | ICD-10-CM | POA: Diagnosis not present

## 2019-09-18 DIAGNOSIS — W06XXXA Fall from bed, initial encounter: Secondary | ICD-10-CM | POA: Insufficient documentation

## 2019-09-18 DIAGNOSIS — G309 Alzheimer's disease, unspecified: Secondary | ICD-10-CM | POA: Diagnosis not present

## 2019-09-18 DIAGNOSIS — M47812 Spondylosis without myelopathy or radiculopathy, cervical region: Secondary | ICD-10-CM | POA: Diagnosis not present

## 2019-09-18 DIAGNOSIS — M2550 Pain in unspecified joint: Secondary | ICD-10-CM | POA: Diagnosis not present

## 2019-09-18 DIAGNOSIS — R404 Transient alteration of awareness: Secondary | ICD-10-CM | POA: Diagnosis not present

## 2019-09-18 DIAGNOSIS — G9389 Other specified disorders of brain: Secondary | ICD-10-CM | POA: Diagnosis not present

## 2019-09-18 MED ORDER — BACITRACIN ZINC 500 UNIT/GM EX OINT: 1.0000 "application " | TOPICAL_OINTMENT | Freq: Two times a day (BID) | CUTANEOUS | 0 refills | Status: DC

## 2019-09-18 MED ORDER — BACITRACIN ZINC 500 UNIT/GM EX OINT
TOPICAL_OINTMENT | Freq: Once | CUTANEOUS | Status: AC
  Filled 2019-09-18: qty 1.8

## 2019-09-18 NOTE — Discharge Instructions (Signed)
Dress wound twice daily with bacitracin ointment.  Follow up with primary care for recheck. Return to ER as needed.

## 2019-09-18 NOTE — ED Notes (Signed)
Pt provided with turkey sandwich and apple juice.

## 2019-09-18 NOTE — ED Triage Notes (Addendum)
Pt BIBA from [facility] on [place]-  Per EMS- Pt here for unwitnessed fall, found on floor 1245 today, pt reports hitting his head.   Skin tear to left elbow, right wrist.   Hx of alzheimers. Pt  mentating at baseline (AOx1), per facility staff. Denies blood thinners.

## 2019-09-18 NOTE — ED Provider Notes (Signed)
[facility] DEPT Provider Note   CSN: [id number] Arrival date & time: 09/18/19  1256     History Chief Complaint  Patient presents with  . Fall    Steven Kline is a 79 y.o. male.  79 year old male brought in by EMS from memory care facility after falling out of bed today, patient's daughter/POA is at bedside.  Patient has a history of advanced Alzheimer's, A. fib (not anticoagulated, bladder cancer (not currently undergoing treatment), diabetes and additional history as listed below.  Daughter reports skin tears to both arms as well as contusion to the head.  Patient states that he did hit his head, unknown loss of consciousness.  Patient is not a reliable historian.        Past Medical History:  Diagnosis Date  . Atrial fibrillation ([place])   . Baker's cyst of knee   . Bladder cancer ([place])    history of (refused preventative treatments)  . Frequency of urination   . Hematuria   . History of gastric ulcer    s/p  exp. lap.  repair perforated gastric ulcer  . History of stress test    per pt had test done this year 2018 approx. in spring or early summer-- pt does not remember who or where  . Hyperlipidemia   . Hypertension   . Nocturia   . OSA (obstructive sleep apnea)    per pt no longer using cpap---  remote hx sleep apnea dx  . Type 2 diabetes mellitus [facility])     Patient Active Problem List   Diagnosis Date Noted  . Change in bowel habits 08/28/2017    Past Surgical History:  Procedure Laterality Date  . COLONOSCOPY  last one 03-01-2012  . INGUINAL HERNIA REPAIR Left 1960  . REPAIR OF PERFORATED ULCER  09-14-2015   at [facility], [place]   via explor. lap.  . TRANSURETHRAL RESECTION OF BLADDER TUMOR N/A 11/06/2016   Procedure: TRANSURETHRAL RESECTION OF BLADDER TUMOR (TURBT), MEATAL DILITATION;  Surgeon: [name], MD;  Location: [facility];  Service: Urology;  Laterality: N/A;  . TRANSURETHRAL RESECTION OF  BLADDER TUMOR N/A 06/06/2017   Procedure: TRANSURETHRAL RESECTION OF BLADDER TUMOR (TURBT);  Surgeon: [name], MD;  Location: [facility];  Service: Urology;  Laterality: N/A;  . UVULOPALATOPHARYNGOPLASTY  yrs ago (date?)   [facility]       Family History  Problem Relation Age of Onset  . Lung cancer Mother        smoker  . Cancer Father   . Heart Problems Father   . Dementia Father   . Stroke Sister   . Colon cancer Neg Hx   . Stomach cancer Neg Hx     Social History   Tobacco Use  . Smoking status: Current Every Day Smoker    Years: 63.00    Types: Cigars  . Smokeless tobacco: Never Used  . Tobacco comment: smokes 6-7 cigars a day  Vaping Use  . Vaping Use: Never used  Substance Use Topics  . Alcohol use: No  . Drug use: No    Home Medications Prior to Admission medications   Medication Sig Start Date End Date Taking? Authorizing Provider  citalopram (CELEXA) 20 MG tablet Take 20 mg by mouth daily. (0800) 07/08/19  Yes [provider]  divalproex (DEPAKOTE SPRINKLE) 125 MG capsule Take 125 mg by mouth 2 (two) times daily. Give with food or beverage. (0800 & 2000)  Yes [provider]  donepezil (ARICEPT) 10 MG tablet Take 10 mg by mouth at bedtime. (2000) 05/20/19  Yes [provider]  LORazepam (ATIVAN) 0.5 MG tablet Take 0.25 mg by mouth every 12 (twelve) hours as needed for anxiety (agitation).   Yes [provider]  metFORMIN (GLUCOPHAGE) 1000 MG tablet Take 1,000 mg by mouth daily. (0800) 02/16/18  Yes [provider]  Nutritional Supplements (ENSURE NUTRITION SHAKE PO) Take 1 Container by mouth 2 (two) times daily.   Yes [provider]  pravastatin (PRAVACHOL) 80 MG tablet Take 80 mg by mouth daily. (0800)   Yes [provider]  Probiotic Product (PROBIOTIC PO) Take 1 capsule by mouth daily. (0800)   Yes [provider]  Wheat Dextrin (BENEFIBER PO) Take 1 packet by mouth  daily. Dissolve in 8 ounces of liquid. (0800)   Yes [provider]  bacitracin ointment Apply 1 application topically 2 (two) times daily. Change dressing twice daily, apply bacitracin with each dressing change. 09/18/19   Tacy Learn, PA-C    Allergies    Seroquel [quetiapine]  Review of Systems   Review of Systems  Unable to perform ROS: Dementia (Level 5 caveat applies due to advanced Alzheimer's)    Physical Exam Updated Vital Signs BP 127/63 (BP Location: Left Arm)   Pulse (!) 54   Temp 98 F (36.7 C) (Oral)   Resp 20   SpO2 99%   Physical Exam Vitals and nursing note reviewed.  Constitutional:      General: He is not in acute distress.    Appearance: He is well-developed. He is not diaphoretic.  HENT:     Head: Normocephalic.     Mouth/Throat:     Mouth: Mucous membranes are moist.  Eyes:     Extraocular Movements: Extraocular movements intact.     Pupils: Pupils are equal, round, and reactive to light.  Cardiovascular:     Rate and Rhythm: Normal rate and regular rhythm.     Pulses: Normal pulses.     Heart sounds: Normal heart sounds.  Pulmonary:     Effort: Pulmonary effort is normal.     Breath sounds: Normal breath sounds.  Abdominal:     Palpations: Abdomen is soft.     Tenderness: There is no abdominal tenderness.  Musculoskeletal:        General: No swelling, tenderness or deformity. Normal range of motion.     Cervical back: Normal range of motion and neck supple. No tenderness or bony tenderness.     Thoracic back: No tenderness or bony tenderness.     Lumbar back: No tenderness or bony tenderness.     Comments: Minor skin tear with ecchymosis to left elbow, range of motion normal, no bony tenderness or swelling, no crepitus.  Minor skin tear to right forearm along distal ulna, no bony tenderness, no swelling, no crepitus.  Range of motion normal.  No pain with range of motion of upper or lower extremities, pelvis is nontender and stable, no  chest wall tenderness.  No pain with palpation of neck or back.  Skin:    General: Skin is warm and dry.     Findings: Bruising present.  Neurological:     Mental Status: He is alert. Mental status is at baseline.     Comments: At baseline per daughter  Psychiatric:        Behavior: Behavior normal.     ED Results / Procedures / Treatments  Labs (all labs ordered are listed, but only abnormal results are displayed) Labs Reviewed - No data to display  EKG None  Radiology CT Head Wo Contrast  Result Date: 09/18/2019 CLINICAL DATA:  Unwitnessed fall EXAM: CT HEAD WITHOUT CONTRAST TECHNIQUE: Contiguous axial images were obtained from the base of the skull through the vertex without intravenous contrast. COMPARISON:  None. FINDINGS: Brain: There is no acute intracranial hemorrhage, mass effect, or edema. No acute appearing loss of gray-white differentiation. There are small chronic cortical infarcts primarily involving the left frontoparietal lobes. Additional chronic small vessel infarcts of the basal ganglia and central white matter. Otherwise patchy and confluent areas of hypoattenuation in the supratentorial white matter are nonspecific but probably reflect moderate chronic microvascular ischemic changes. There is no extra-axial fluid collection. Prominence of the ventricles sulci generalized volume loss. Vascular: There is mild atherosclerotic calcification at the skull base. Skull: Calvarium is unremarkable. Sinuses/Orbits: No acute finding. Other: None. IMPRESSION: No evidence of acute intracranial injury. Chronic findings detailed above. Electronically Signed   By: Macy Mis M.D.   On: 09/18/2019 15:43   CT Cervical Spine Wo Contrast  Result Date: 09/18/2019 CLINICAL DATA:  Unwitnessed fall EXAM: CT CERVICAL SPINE WITHOUT CONTRAST TECHNIQUE: Multidetector CT imaging of the cervical spine was performed without intravenous contrast. Multiplanar CT image reconstructions were also  generated. COMPARISON:  None. FINDINGS: Alignment: No significant listhesis. Skull base and vertebrae: No acute fracture of the cervical spine. Vertebral body heights are maintained apart from degenerative endplate irregularity. Focus of sclerosis at the left aspect of the C2 vertebral body likely reflects a bone island. Soft tissues and spinal canal: No prevertebral fluid or swelling. No visible canal hematoma. Disc levels: Multilevel degenerative changes are present including disc space narrowing, endplate osteophytes, and facet and uncovertebral hypertrophy. There is no high-grade osseous encroachment on the spinal, which is mostly obscured by artifact. Foraminal stenosis is greatest left at C5-C6. Upper chest: No apical lung mass. Other: None. IMPRESSION: No acute cervical spine fracture. Electronically Signed   By: Macy Mis M.D.   On: 09/18/2019 15:49    Procedures Procedures (including critical care time)  Medications Ordered in ED Medications  bacitracin ointment (has no administration in time range)    ED Course  I have reviewed the triage vital signs and the nursing notes.  Pertinent labs & imaging results that were available during my care of the patient were reviewed by me and considered in my medical decision making (see chart for details).  Clinical Course as of Sep 17 1644  Thu Sep 17, 6760  [id number] 79 year old male presents for evaluation after a fall. Patient's daughter is at bedside, states he complained of a headache which concerned her. Patient was found to have minor skin tears to  the left elbow and right forearm, no tenderness with palpation or ROM. No other injuries identified. CT head and c-spine without acute injuries. Patient discussed with Dr. Ron Parker, ER attending who has seen patient. Plan is to dc to facility, rx for bacitracin with BID dressing changes. Recommend recheck with PCP, return to ER as needed.   [LM]    Clinical Course User Index [LM] Roque Lias   MDM Rules/Calculators/A&P                          Final Clinical Impression(s) / ED Diagnoses Final diagnoses:  Fall, initial encounter  Skin tear of left elbow without complication,  initial encounter  Skin tear of right forearm without complication, initial encounter    Rx / DC Orders ED Discharge Orders         Ordered    bacitracin ointment  2 times daily,   Status:  Discontinued     Reprint     09/18/19 1611    bacitracin ointment  2 times daily     Discontinue  Reprint     09/18/19 1614           Tacy Learn, PA-C 09/18/19 1646    Breck Coons, MD 09/19/19 [phone]

## 2019-09-18 NOTE — ED Notes (Signed)
Pt transported to CT ?

## 2019-09-18 NOTE — ED Notes (Signed)
Family at bedside. 

## 2019-09-18 NOTE — ED Notes (Signed)
ED Provider at bedside. 

## 2019-09-18 NOTE — ED Notes (Signed)
Report called to Wynelle Link, Therapist, sports at [facility] on [address].

## 2019-09-18 NOTE — ED Notes (Signed)
[facility] notified of need for transport. 

## 2019-09-22 DIAGNOSIS — M1990 Unspecified osteoarthritis, unspecified site: Secondary | ICD-10-CM | POA: Diagnosis not present

## 2019-09-22 DIAGNOSIS — R1310 Dysphagia, unspecified: Secondary | ICD-10-CM | POA: Diagnosis not present

## 2019-09-22 DIAGNOSIS — F0280 Dementia in other diseases classified elsewhere without behavioral disturbance: Secondary | ICD-10-CM | POA: Diagnosis not present

## 2019-09-22 DIAGNOSIS — G309 Alzheimer's disease, unspecified: Secondary | ICD-10-CM | POA: Diagnosis not present

## 2019-09-22 DIAGNOSIS — E119 Type 2 diabetes mellitus without complications: Secondary | ICD-10-CM | POA: Diagnosis not present

## 2019-09-22 DIAGNOSIS — E785 Hyperlipidemia, unspecified: Secondary | ICD-10-CM | POA: Diagnosis not present

## 2019-09-24 DIAGNOSIS — M1990 Unspecified osteoarthritis, unspecified site: Secondary | ICD-10-CM | POA: Diagnosis not present

## 2019-09-24 DIAGNOSIS — F0280 Dementia in other diseases classified elsewhere without behavioral disturbance: Secondary | ICD-10-CM | POA: Diagnosis not present

## 2019-09-24 DIAGNOSIS — R1310 Dysphagia, unspecified: Secondary | ICD-10-CM | POA: Diagnosis not present

## 2019-09-24 DIAGNOSIS — E119 Type 2 diabetes mellitus without complications: Secondary | ICD-10-CM | POA: Diagnosis not present

## 2019-09-24 DIAGNOSIS — E785 Hyperlipidemia, unspecified: Secondary | ICD-10-CM | POA: Diagnosis not present

## 2019-09-24 DIAGNOSIS — G309 Alzheimer's disease, unspecified: Secondary | ICD-10-CM | POA: Diagnosis not present

## 2019-09-25 DIAGNOSIS — G309 Alzheimer's disease, unspecified: Secondary | ICD-10-CM | POA: Diagnosis not present

## 2019-09-25 DIAGNOSIS — R1310 Dysphagia, unspecified: Secondary | ICD-10-CM | POA: Diagnosis not present

## 2019-09-25 DIAGNOSIS — F0280 Dementia in other diseases classified elsewhere without behavioral disturbance: Secondary | ICD-10-CM | POA: Diagnosis not present

## 2019-09-25 DIAGNOSIS — E119 Type 2 diabetes mellitus without complications: Secondary | ICD-10-CM | POA: Diagnosis not present

## 2019-09-25 DIAGNOSIS — M1990 Unspecified osteoarthritis, unspecified site: Secondary | ICD-10-CM | POA: Diagnosis not present

## 2019-09-25 DIAGNOSIS — E785 Hyperlipidemia, unspecified: Secondary | ICD-10-CM | POA: Diagnosis not present

## 2019-09-26 DIAGNOSIS — G309 Alzheimer's disease, unspecified: Secondary | ICD-10-CM | POA: Diagnosis not present

## 2019-09-26 DIAGNOSIS — R1310 Dysphagia, unspecified: Secondary | ICD-10-CM | POA: Diagnosis not present

## 2019-09-26 DIAGNOSIS — F0280 Dementia in other diseases classified elsewhere without behavioral disturbance: Secondary | ICD-10-CM | POA: Diagnosis not present

## 2019-09-26 DIAGNOSIS — M1990 Unspecified osteoarthritis, unspecified site: Secondary | ICD-10-CM | POA: Diagnosis not present

## 2019-09-26 DIAGNOSIS — E785 Hyperlipidemia, unspecified: Secondary | ICD-10-CM | POA: Diagnosis not present

## 2019-09-26 DIAGNOSIS — E119 Type 2 diabetes mellitus without complications: Secondary | ICD-10-CM | POA: Diagnosis not present

## 2019-09-29 DIAGNOSIS — E119 Type 2 diabetes mellitus without complications: Secondary | ICD-10-CM | POA: Diagnosis not present

## 2019-09-29 DIAGNOSIS — F0280 Dementia in other diseases classified elsewhere without behavioral disturbance: Secondary | ICD-10-CM | POA: Diagnosis not present

## 2019-09-29 DIAGNOSIS — M1990 Unspecified osteoarthritis, unspecified site: Secondary | ICD-10-CM | POA: Diagnosis not present

## 2019-09-29 DIAGNOSIS — R1310 Dysphagia, unspecified: Secondary | ICD-10-CM | POA: Diagnosis not present

## 2019-09-29 DIAGNOSIS — E785 Hyperlipidemia, unspecified: Secondary | ICD-10-CM | POA: Diagnosis not present

## 2019-09-29 DIAGNOSIS — G309 Alzheimer's disease, unspecified: Secondary | ICD-10-CM | POA: Diagnosis not present

## 2019-09-30 DIAGNOSIS — R1310 Dysphagia, unspecified: Secondary | ICD-10-CM | POA: Diagnosis not present

## 2019-09-30 DIAGNOSIS — E785 Hyperlipidemia, unspecified: Secondary | ICD-10-CM | POA: Diagnosis not present

## 2019-09-30 DIAGNOSIS — G309 Alzheimer's disease, unspecified: Secondary | ICD-10-CM | POA: Diagnosis not present

## 2019-09-30 DIAGNOSIS — E119 Type 2 diabetes mellitus without complications: Secondary | ICD-10-CM | POA: Diagnosis not present

## 2019-09-30 DIAGNOSIS — F0280 Dementia in other diseases classified elsewhere without behavioral disturbance: Secondary | ICD-10-CM | POA: Diagnosis not present

## 2019-09-30 DIAGNOSIS — M1990 Unspecified osteoarthritis, unspecified site: Secondary | ICD-10-CM | POA: Diagnosis not present

## 2019-10-01 DIAGNOSIS — M1990 Unspecified osteoarthritis, unspecified site: Secondary | ICD-10-CM | POA: Diagnosis not present

## 2019-10-01 DIAGNOSIS — E785 Hyperlipidemia, unspecified: Secondary | ICD-10-CM | POA: Diagnosis not present

## 2019-10-01 DIAGNOSIS — F0280 Dementia in other diseases classified elsewhere without behavioral disturbance: Secondary | ICD-10-CM | POA: Diagnosis not present

## 2019-10-01 DIAGNOSIS — G309 Alzheimer's disease, unspecified: Secondary | ICD-10-CM | POA: Diagnosis not present

## 2019-10-01 DIAGNOSIS — R1310 Dysphagia, unspecified: Secondary | ICD-10-CM | POA: Diagnosis not present

## 2019-10-01 DIAGNOSIS — E119 Type 2 diabetes mellitus without complications: Secondary | ICD-10-CM | POA: Diagnosis not present

## 2019-10-03 DIAGNOSIS — F0280 Dementia in other diseases classified elsewhere without behavioral disturbance: Secondary | ICD-10-CM | POA: Diagnosis not present

## 2019-10-03 DIAGNOSIS — M1990 Unspecified osteoarthritis, unspecified site: Secondary | ICD-10-CM | POA: Diagnosis not present

## 2019-10-03 DIAGNOSIS — R1310 Dysphagia, unspecified: Secondary | ICD-10-CM | POA: Diagnosis not present

## 2019-10-03 DIAGNOSIS — E119 Type 2 diabetes mellitus without complications: Secondary | ICD-10-CM | POA: Diagnosis not present

## 2019-10-03 DIAGNOSIS — G309 Alzheimer's disease, unspecified: Secondary | ICD-10-CM | POA: Diagnosis not present

## 2019-10-03 DIAGNOSIS — E785 Hyperlipidemia, unspecified: Secondary | ICD-10-CM | POA: Diagnosis not present

## 2019-10-07 DIAGNOSIS — F0280 Dementia in other diseases classified elsewhere without behavioral disturbance: Secondary | ICD-10-CM | POA: Diagnosis not present

## 2019-10-07 DIAGNOSIS — G309 Alzheimer's disease, unspecified: Secondary | ICD-10-CM | POA: Diagnosis not present

## 2019-10-07 DIAGNOSIS — E119 Type 2 diabetes mellitus without complications: Secondary | ICD-10-CM | POA: Diagnosis not present

## 2019-10-07 DIAGNOSIS — M1990 Unspecified osteoarthritis, unspecified site: Secondary | ICD-10-CM | POA: Diagnosis not present

## 2019-10-07 DIAGNOSIS — E785 Hyperlipidemia, unspecified: Secondary | ICD-10-CM | POA: Diagnosis not present

## 2019-10-07 DIAGNOSIS — R1310 Dysphagia, unspecified: Secondary | ICD-10-CM | POA: Diagnosis not present

## 2019-10-09 DIAGNOSIS — G309 Alzheimer's disease, unspecified: Secondary | ICD-10-CM | POA: Diagnosis not present

## 2019-10-09 DIAGNOSIS — E785 Hyperlipidemia, unspecified: Secondary | ICD-10-CM | POA: Diagnosis not present

## 2019-10-09 DIAGNOSIS — E119 Type 2 diabetes mellitus without complications: Secondary | ICD-10-CM | POA: Diagnosis not present

## 2019-10-09 DIAGNOSIS — M1990 Unspecified osteoarthritis, unspecified site: Secondary | ICD-10-CM | POA: Diagnosis not present

## 2019-10-09 DIAGNOSIS — F0280 Dementia in other diseases classified elsewhere without behavioral disturbance: Secondary | ICD-10-CM | POA: Diagnosis not present

## 2019-10-09 DIAGNOSIS — R1310 Dysphagia, unspecified: Secondary | ICD-10-CM | POA: Diagnosis not present

## 2019-10-10 DIAGNOSIS — F331 Major depressive disorder, recurrent, moderate: Secondary | ICD-10-CM | POA: Diagnosis not present

## 2019-10-10 DIAGNOSIS — G300 Alzheimer's disease with early onset: Secondary | ICD-10-CM | POA: Diagnosis not present

## 2019-10-10 DIAGNOSIS — F0281 Dementia in other diseases classified elsewhere with behavioral disturbance: Secondary | ICD-10-CM | POA: Diagnosis not present

## 2019-10-12 DIAGNOSIS — M1990 Unspecified osteoarthritis, unspecified site: Secondary | ICD-10-CM | POA: Diagnosis not present

## 2019-10-12 DIAGNOSIS — E119 Type 2 diabetes mellitus without complications: Secondary | ICD-10-CM | POA: Diagnosis not present

## 2019-10-12 DIAGNOSIS — E785 Hyperlipidemia, unspecified: Secondary | ICD-10-CM | POA: Diagnosis not present

## 2019-10-12 DIAGNOSIS — Z9181 History of falling: Secondary | ICD-10-CM | POA: Diagnosis not present

## 2019-10-12 DIAGNOSIS — G309 Alzheimer's disease, unspecified: Secondary | ICD-10-CM | POA: Diagnosis not present

## 2019-10-12 DIAGNOSIS — R1310 Dysphagia, unspecified: Secondary | ICD-10-CM | POA: Diagnosis not present

## 2019-10-12 DIAGNOSIS — F0280 Dementia in other diseases classified elsewhere without behavioral disturbance: Secondary | ICD-10-CM | POA: Diagnosis not present

## 2019-10-13 DIAGNOSIS — R1310 Dysphagia, unspecified: Secondary | ICD-10-CM | POA: Diagnosis not present

## 2019-10-13 DIAGNOSIS — E785 Hyperlipidemia, unspecified: Secondary | ICD-10-CM | POA: Diagnosis not present

## 2019-10-13 DIAGNOSIS — F0280 Dementia in other diseases classified elsewhere without behavioral disturbance: Secondary | ICD-10-CM | POA: Diagnosis not present

## 2019-10-13 DIAGNOSIS — M1990 Unspecified osteoarthritis, unspecified site: Secondary | ICD-10-CM | POA: Diagnosis not present

## 2019-10-13 DIAGNOSIS — E119 Type 2 diabetes mellitus without complications: Secondary | ICD-10-CM | POA: Diagnosis not present

## 2019-10-13 DIAGNOSIS — G309 Alzheimer's disease, unspecified: Secondary | ICD-10-CM | POA: Diagnosis not present

## 2019-10-15 DIAGNOSIS — M1990 Unspecified osteoarthritis, unspecified site: Secondary | ICD-10-CM | POA: Diagnosis not present

## 2019-10-15 DIAGNOSIS — E119 Type 2 diabetes mellitus without complications: Secondary | ICD-10-CM | POA: Diagnosis not present

## 2019-10-15 DIAGNOSIS — E785 Hyperlipidemia, unspecified: Secondary | ICD-10-CM | POA: Diagnosis not present

## 2019-10-15 DIAGNOSIS — G309 Alzheimer's disease, unspecified: Secondary | ICD-10-CM | POA: Diagnosis not present

## 2019-10-15 DIAGNOSIS — R1310 Dysphagia, unspecified: Secondary | ICD-10-CM | POA: Diagnosis not present

## 2019-10-15 DIAGNOSIS — F0280 Dementia in other diseases classified elsewhere without behavioral disturbance: Secondary | ICD-10-CM | POA: Diagnosis not present

## 2019-10-16 DIAGNOSIS — F0280 Dementia in other diseases classified elsewhere without behavioral disturbance: Secondary | ICD-10-CM | POA: Diagnosis not present

## 2019-10-16 DIAGNOSIS — R1310 Dysphagia, unspecified: Secondary | ICD-10-CM | POA: Diagnosis not present

## 2019-10-16 DIAGNOSIS — M1990 Unspecified osteoarthritis, unspecified site: Secondary | ICD-10-CM | POA: Diagnosis not present

## 2019-10-16 DIAGNOSIS — G309 Alzheimer's disease, unspecified: Secondary | ICD-10-CM | POA: Diagnosis not present

## 2019-10-16 DIAGNOSIS — E785 Hyperlipidemia, unspecified: Secondary | ICD-10-CM | POA: Diagnosis not present

## 2019-10-16 DIAGNOSIS — E119 Type 2 diabetes mellitus without complications: Secondary | ICD-10-CM | POA: Diagnosis not present

## 2019-10-20 DIAGNOSIS — R1310 Dysphagia, unspecified: Secondary | ICD-10-CM | POA: Diagnosis not present

## 2019-10-20 DIAGNOSIS — E119 Type 2 diabetes mellitus without complications: Secondary | ICD-10-CM | POA: Diagnosis not present

## 2019-10-20 DIAGNOSIS — E785 Hyperlipidemia, unspecified: Secondary | ICD-10-CM | POA: Diagnosis not present

## 2019-10-20 DIAGNOSIS — F0281 Dementia in other diseases classified elsewhere with behavioral disturbance: Secondary | ICD-10-CM | POA: Diagnosis not present

## 2019-10-20 DIAGNOSIS — F0280 Dementia in other diseases classified elsewhere without behavioral disturbance: Secondary | ICD-10-CM | POA: Diagnosis not present

## 2019-10-20 DIAGNOSIS — M1990 Unspecified osteoarthritis, unspecified site: Secondary | ICD-10-CM | POA: Diagnosis not present

## 2019-10-20 DIAGNOSIS — F331 Major depressive disorder, recurrent, moderate: Secondary | ICD-10-CM | POA: Diagnosis not present

## 2019-10-20 DIAGNOSIS — G300 Alzheimer's disease with early onset: Secondary | ICD-10-CM | POA: Diagnosis not present

## 2019-10-20 DIAGNOSIS — G309 Alzheimer's disease, unspecified: Secondary | ICD-10-CM | POA: Diagnosis not present

## 2019-10-21 DIAGNOSIS — F0280 Dementia in other diseases classified elsewhere without behavioral disturbance: Secondary | ICD-10-CM | POA: Diagnosis not present

## 2019-10-21 DIAGNOSIS — E119 Type 2 diabetes mellitus without complications: Secondary | ICD-10-CM | POA: Diagnosis not present

## 2019-10-21 DIAGNOSIS — R1310 Dysphagia, unspecified: Secondary | ICD-10-CM | POA: Diagnosis not present

## 2019-10-21 DIAGNOSIS — M1990 Unspecified osteoarthritis, unspecified site: Secondary | ICD-10-CM | POA: Diagnosis not present

## 2019-10-21 DIAGNOSIS — E785 Hyperlipidemia, unspecified: Secondary | ICD-10-CM | POA: Diagnosis not present

## 2019-10-21 DIAGNOSIS — G309 Alzheimer's disease, unspecified: Secondary | ICD-10-CM | POA: Diagnosis not present

## 2019-10-23 DIAGNOSIS — M1990 Unspecified osteoarthritis, unspecified site: Secondary | ICD-10-CM | POA: Diagnosis not present

## 2019-10-23 DIAGNOSIS — E785 Hyperlipidemia, unspecified: Secondary | ICD-10-CM | POA: Diagnosis not present

## 2019-10-23 DIAGNOSIS — G309 Alzheimer's disease, unspecified: Secondary | ICD-10-CM | POA: Diagnosis not present

## 2019-10-23 DIAGNOSIS — F0280 Dementia in other diseases classified elsewhere without behavioral disturbance: Secondary | ICD-10-CM | POA: Diagnosis not present

## 2019-10-23 DIAGNOSIS — E119 Type 2 diabetes mellitus without complications: Secondary | ICD-10-CM | POA: Diagnosis not present

## 2019-10-23 DIAGNOSIS — R1310 Dysphagia, unspecified: Secondary | ICD-10-CM | POA: Diagnosis not present

## 2019-10-27 DIAGNOSIS — F0280 Dementia in other diseases classified elsewhere without behavioral disturbance: Secondary | ICD-10-CM | POA: Diagnosis not present

## 2019-10-27 DIAGNOSIS — G309 Alzheimer's disease, unspecified: Secondary | ICD-10-CM | POA: Diagnosis not present

## 2019-10-27 DIAGNOSIS — R1310 Dysphagia, unspecified: Secondary | ICD-10-CM | POA: Diagnosis not present

## 2019-10-27 DIAGNOSIS — E119 Type 2 diabetes mellitus without complications: Secondary | ICD-10-CM | POA: Diagnosis not present

## 2019-10-27 DIAGNOSIS — M1990 Unspecified osteoarthritis, unspecified site: Secondary | ICD-10-CM | POA: Diagnosis not present

## 2019-10-27 DIAGNOSIS — E785 Hyperlipidemia, unspecified: Secondary | ICD-10-CM | POA: Diagnosis not present

## 2019-10-28 DIAGNOSIS — Z5181 Encounter for therapeutic drug level monitoring: Secondary | ICD-10-CM | POA: Diagnosis not present

## 2019-11-01 ENCOUNTER — Other Ambulatory Visit: Payer: Self-pay

## 2019-11-01 ENCOUNTER — Emergency Department (HOSPITAL_COMMUNITY)
Admission: EM | Admit: 2019-11-01 | Discharge: 2019-11-02 | Disposition: A | Discharge status: Home / Self Care | Payer: Medicare Other | Attending: Emergency Medicine | Admitting: Emergency Medicine

## 2019-11-01 ENCOUNTER — Encounter (HOSPITAL_COMMUNITY): Payer: Self-pay | Admitting: Emergency Medicine

## 2019-11-01 DIAGNOSIS — Z043 Encounter for examination and observation following other accident: Secondary | ICD-10-CM | POA: Insufficient documentation

## 2019-11-01 DIAGNOSIS — R457 State of emotional shock and stress, unspecified: Secondary | ICD-10-CM | POA: Diagnosis not present

## 2019-11-01 DIAGNOSIS — W010XXA Fall on same level from slipping, tripping and stumbling without subsequent striking against object, initial encounter: Secondary | ICD-10-CM | POA: Diagnosis not present

## 2019-11-01 DIAGNOSIS — Y999 Unspecified external cause status: Secondary | ICD-10-CM | POA: Insufficient documentation

## 2019-11-01 DIAGNOSIS — E119 Type 2 diabetes mellitus without complications: Secondary | ICD-10-CM | POA: Diagnosis not present

## 2019-11-01 DIAGNOSIS — F17290 Nicotine dependence, other tobacco product, uncomplicated: Secondary | ICD-10-CM | POA: Insufficient documentation

## 2019-11-01 DIAGNOSIS — I10 Essential (primary) hypertension: Secondary | ICD-10-CM | POA: Insufficient documentation

## 2019-11-01 DIAGNOSIS — I6529 Occlusion and stenosis of unspecified carotid artery: Secondary | ICD-10-CM | POA: Diagnosis not present

## 2019-11-01 DIAGNOSIS — Z79899 Other long term (current) drug therapy: Secondary | ICD-10-CM | POA: Diagnosis not present

## 2019-11-01 DIAGNOSIS — Z7984 Long term (current) use of oral hypoglycemic drugs: Secondary | ICD-10-CM | POA: Insufficient documentation

## 2019-11-01 DIAGNOSIS — Z0000 Encounter for general adult medical examination without abnormal findings: Secondary | ICD-10-CM | POA: Diagnosis not present

## 2019-11-01 DIAGNOSIS — C679 Malignant neoplasm of bladder, unspecified: Secondary | ICD-10-CM | POA: Diagnosis not present

## 2019-11-01 DIAGNOSIS — I7090 Unspecified atherosclerosis: Secondary | ICD-10-CM | POA: Diagnosis not present

## 2019-11-01 DIAGNOSIS — F0390 Unspecified dementia without behavioral disturbance: Secondary | ICD-10-CM | POA: Insufficient documentation

## 2019-11-01 DIAGNOSIS — Y9389 Activity, other specified: Secondary | ICD-10-CM | POA: Diagnosis not present

## 2019-11-01 DIAGNOSIS — Y9289 Other specified places as the place of occurrence of the external cause: Secondary | ICD-10-CM | POA: Insufficient documentation

## 2019-11-01 DIAGNOSIS — I6381 Other cerebral infarction due to occlusion or stenosis of small artery: Secondary | ICD-10-CM | POA: Diagnosis not present

## 2019-11-01 DIAGNOSIS — W19XXXA Unspecified fall, initial encounter: Secondary | ICD-10-CM | POA: Diagnosis not present

## 2019-11-01 DIAGNOSIS — Z743 Need for continuous supervision: Secondary | ICD-10-CM | POA: Diagnosis not present

## 2019-11-01 NOTE — ED Triage Notes (Signed)
Per EMS, Pt is coming from [facility] on [address]. Pt is from the memory care unit, coming in for a fall. No injuries/deformitys noted. Pt alert to his baseline, pt has no complaints.

## 2019-11-01 NOTE — Discharge Instructions (Signed)
It was our pleasure to provide your ER care today - we hope that you feel better.  Fall precautions.   Return to ER if worse, new symptoms, new or severe pain, change in mental status, vomiting, trouble breathing, fainting, or other concern.

## 2019-11-01 NOTE — ED Provider Notes (Signed)
[facility] DEPT Provider Note   CSN: [id number] Arrival date & time: 11/01/19  2221     History Chief Complaint  Patient presents with  . Fall    Steven Kline is a 79 y.o. male.  Patient with hx dementia, presents via EMS from [facility] with report of fall this evening. No LOC. Patient mental status noted to remains c/w baseline post fall. Has been ambulatory since fall. Patient denies any pain. EMS did not note any specific injury. Skin intact. Pt denies headache. No cp or sob. No abd pain or nvd. No lacerations. No extremity pain. Per EMS report, pts recent health o/w noted to be consistent with baseline. No anticoag use.   The history is provided by the patient and the EMS personnel. The history is limited by the condition of the patient.       Past Medical History:  Diagnosis Date  . Atrial fibrillation ([place])   . Baker's cyst of knee   . Bladder cancer ([place])    history of (refused preventative treatments)  . Frequency of urination   . Hematuria   . History of gastric ulcer    s/p  exp. lap.  repair perforated gastric ulcer  . History of stress test    per pt had test done this year 2018 approx. in spring or early summer-- pt does not remember who or where  . Hyperlipidemia   . Hypertension   . Nocturia   . OSA (obstructive sleep apnea)    per pt no longer using cpap---  remote hx sleep apnea dx  . Type 2 diabetes mellitus [facility])     Patient Active Problem List   Diagnosis Date Noted  . Change in bowel habits 08/28/2017    Past Surgical History:  Procedure Laterality Date  . COLONOSCOPY  last one 03-01-2012  . INGUINAL HERNIA REPAIR Left 1960  . REPAIR OF PERFORATED ULCER  09-14-2015   at [facility], [place]   via explor. lap.  . TRANSURETHRAL RESECTION OF BLADDER TUMOR N/A 11/06/2016   Procedure: TRANSURETHRAL RESECTION OF BLADDER TUMOR (TURBT), MEATAL DILITATION;  Surgeon: [name], MD;  Location: [facility];   Service: Urology;  Laterality: N/A;  . TRANSURETHRAL RESECTION OF BLADDER TUMOR N/A 06/06/2017   Procedure: TRANSURETHRAL RESECTION OF BLADDER TUMOR (TURBT);  Surgeon: [name], MD;  Location: [facility];  Service: Urology;  Laterality: N/A;  . UVULOPALATOPHARYNGOPLASTY  yrs ago (date?)   [facility]       Family History  Problem Relation Age of Onset  . Lung cancer Mother        smoker  . Cancer Father   . Heart Problems Father   . Dementia Father   . Stroke Sister   . Colon cancer Neg Hx   . Stomach cancer Neg Hx     Social History   Tobacco Use  . Smoking status: Current Every Day Smoker    Years: 63.00    Types: Cigars  . Smokeless tobacco: Never Used  . Tobacco comment: smokes 6-7 cigars a day  Vaping Use  . Vaping Use: Never used  Substance Use Topics  . Alcohol use: No  . Drug use: No    Home Medications Prior to Admission medications   Medication Sig Start Date End Date Taking? Authorizing Provider  bacitracin ointment Apply 1 application topically 2 (two) times daily. Change dressing twice daily, apply bacitracin with each dressing change. 09/18/19   Percell Miller,  Hewitt Shorts, PA-C  citalopram (CELEXA) 20 MG tablet Take 20 mg by mouth daily. (0800) 07/08/19   [provider]  divalproex (DEPAKOTE SPRINKLE) 125 MG capsule Take 125 mg by mouth 2 (two) times daily. Give with food or beverage. (0800 & 2000)    [provider]  donepezil (ARICEPT) 10 MG tablet Take 10 mg by mouth at bedtime. (2000) 05/20/19   [provider]  LORazepam (ATIVAN) 0.5 MG tablet Take 0.25 mg by mouth every 12 (twelve) hours as needed for anxiety (agitation).    [provider]  metFORMIN (GLUCOPHAGE) 1000 MG tablet Take 1,000 mg by mouth daily. (0800) 02/16/18   [provider]  Nutritional Supplements (ENSURE NUTRITION SHAKE PO) Take 1 Container by mouth 2 (two) times daily.    [provider]  pravastatin (PRAVACHOL) 80 MG tablet  Take 80 mg by mouth daily. (0800)    [provider]  Probiotic Product (PROBIOTIC PO) Take 1 capsule by mouth daily. (0800)    [provider]  Wheat Dextrin (BENEFIBER PO) Take 1 packet by mouth daily. Dissolve in 8 ounces of liquid. (0800)    [provider]    Allergies    Seroquel [quetiapine]  Review of Systems   Review of Systems  Constitutional: Negative for fever.  HENT: Negative for nosebleeds.   Eyes: Negative for redness.  Respiratory: Negative for shortness of breath.   Cardiovascular: Negative for chest pain.  Gastrointestinal: Negative for abdominal pain and vomiting.  Genitourinary: Negative for flank pain.  Musculoskeletal: Negative for back pain and neck pain.  Skin: Negative for wound.  Neurological: Negative for numbness and headaches.  Hematological: Does not bruise/bleed easily.  Psychiatric/Behavioral: Negative for agitation.    Physical Exam Updated Vital Signs BP (!) 114/56   Pulse (!) 59   Temp 98 F (36.7 C)   Resp 18   SpO2 99%   Physical Exam Vitals and nursing note reviewed.  Constitutional:      Appearance: Normal appearance. He is well-developed.  HENT:     Head: Atraumatic.     Comments: No facial, head, or scalp sts or tenderness.     Nose: Nose normal.     Mouth/Throat:     Mouth: Mucous membranes are moist.     Pharynx: Oropharynx is clear.  Eyes:     General: No scleral icterus.    Conjunctiva/sclera: Conjunctivae normal.     Pupils: Pupils are equal, round, and reactive to light.  Neck:     Trachea: No tracheal deviation.  Cardiovascular:     Rate and Rhythm: Normal rate and regular rhythm.     Pulses: Normal pulses.     Heart sounds: Normal heart sounds. No murmur heard.  No friction rub. No gallop.   Pulmonary:     Effort: Pulmonary effort is normal. No accessory muscle usage or respiratory distress.     Breath sounds: Normal breath sounds.  Chest:     Chest wall: No tenderness.  Abdominal:       General: Bowel sounds are normal. There is no distension.     Palpations: Abdomen is soft.     Tenderness: There is no abdominal tenderness. There is no guarding.     Comments: No abd contusion or bruising.   Genitourinary:    Comments: No cva tenderness. Musculoskeletal:        General: No swelling.     Cervical back: Normal range of motion and neck supple. No rigidity or  tenderness.     Comments: CTLS spine, non tender, aligned, no step off. Good rom bil extremities without pain or focal bony tenderness.   Skin:    General: Skin is warm and dry.     Findings: No rash.  Neurological:     Mental Status: He is alert.     Comments: Alert, speech clear. Motor intact bil, stre 5/5. Sens grossly intact bil.   Psychiatric:        Mood and Affect: Mood normal.     ED Results / Procedures / Treatments   Labs (all labs ordered are listed, but only abnormal results are displayed) Labs Reviewed - No data to display  EKG None  Radiology No results found.  Procedures Procedures (including critical care time)  Medications Ordered in ED Medications - No data to display  ED Course  I have reviewed the triage vital signs and the nursing notes.  Pertinent labs & imaging results that were available during my care of the patient were reviewed by me and considered in my medical decision making (see chart for details).    MDM Rules/Calculators/A&P                         Patients mental status described as c/w baseline post fall. No LOC or alteration in LOC. No nv.   No focal pain or tenderness on exam.  Reviewed nursing notes and prior charts for additional history.   Recheck pt, no new c/o, no pain, alert, content.   Patient appears stable for d/c.   Fall precautions recommended.   Return precautions provided.      Final Clinical Impression(s) / ED Diagnoses Final diagnoses:  Fall from slip, trip, or stumble, initial encounter  Normal exam    Rx / DC Orders ED  Discharge Orders    None       Lajean Saver, MD 11/01/19 2304

## 2019-11-02 ENCOUNTER — Other Ambulatory Visit: Payer: Self-pay

## 2019-11-02 ENCOUNTER — Emergency Department (HOSPITAL_COMMUNITY): Payer: Medicare Other

## 2019-11-02 DIAGNOSIS — W19XXXA Unspecified fall, initial encounter: Secondary | ICD-10-CM | POA: Diagnosis not present

## 2019-11-02 DIAGNOSIS — I7090 Unspecified atherosclerosis: Secondary | ICD-10-CM | POA: Diagnosis not present

## 2019-11-02 DIAGNOSIS — Z743 Need for continuous supervision: Secondary | ICD-10-CM | POA: Diagnosis not present

## 2019-11-02 DIAGNOSIS — Z043 Encounter for examination and observation following other accident: Secondary | ICD-10-CM | POA: Diagnosis not present

## 2019-11-02 DIAGNOSIS — I6381 Other cerebral infarction due to occlusion or stenosis of small artery: Secondary | ICD-10-CM | POA: Diagnosis not present

## 2019-11-02 DIAGNOSIS — R6889 Other general symptoms and signs: Secondary | ICD-10-CM | POA: Diagnosis not present

## 2019-11-02 DIAGNOSIS — R279 Unspecified lack of coordination: Secondary | ICD-10-CM | POA: Diagnosis not present

## 2019-11-02 DIAGNOSIS — I6529 Occlusion and stenosis of unspecified carotid artery: Secondary | ICD-10-CM | POA: Diagnosis not present

## 2019-11-02 NOTE — ED Notes (Signed)
PTAR called  

## 2019-11-02 NOTE — ED Provider Notes (Signed)
Care assumed from Dr. Ashok Cordia at shift change.  Patient initially seen for a fall.  The plan was for discharge with no imaging studies.  It was brought to my attention that the daughter was uncomfortable with this.  She was concerned about swelling to his head and eyes.  I was unable to appreciate any significant swelling, however a CT scan was ordered as the patient has a history of baseline dementia and confusion and neurologic exam somewhat difficult secondary to this.  CT has been obtained and is negative.  At this point, feels the patient is appropriate for discharge to his memory care center.  To return as needed for any problems.   Veryl Speak, MD 11/02/19 [phone]

## 2019-11-03 DIAGNOSIS — R1310 Dysphagia, unspecified: Secondary | ICD-10-CM | POA: Diagnosis not present

## 2019-11-03 DIAGNOSIS — W19XXXA Unspecified fall, initial encounter: Secondary | ICD-10-CM | POA: Diagnosis not present

## 2019-11-03 DIAGNOSIS — M150 Primary generalized (osteo)arthritis: Secondary | ICD-10-CM | POA: Diagnosis not present

## 2019-11-04 DIAGNOSIS — E119 Type 2 diabetes mellitus without complications: Secondary | ICD-10-CM | POA: Diagnosis not present

## 2019-11-04 DIAGNOSIS — F0280 Dementia in other diseases classified elsewhere without behavioral disturbance: Secondary | ICD-10-CM | POA: Diagnosis not present

## 2019-11-04 DIAGNOSIS — G309 Alzheimer's disease, unspecified: Secondary | ICD-10-CM | POA: Diagnosis not present

## 2019-11-04 DIAGNOSIS — M1990 Unspecified osteoarthritis, unspecified site: Secondary | ICD-10-CM | POA: Diagnosis not present

## 2019-11-04 DIAGNOSIS — E785 Hyperlipidemia, unspecified: Secondary | ICD-10-CM | POA: Diagnosis not present

## 2019-11-04 DIAGNOSIS — R1310 Dysphagia, unspecified: Secondary | ICD-10-CM | POA: Diagnosis not present

## 2019-11-07 DIAGNOSIS — R1310 Dysphagia, unspecified: Secondary | ICD-10-CM | POA: Diagnosis not present

## 2019-11-07 DIAGNOSIS — F0281 Dementia in other diseases classified elsewhere with behavioral disturbance: Secondary | ICD-10-CM | POA: Diagnosis not present

## 2019-11-07 DIAGNOSIS — M1990 Unspecified osteoarthritis, unspecified site: Secondary | ICD-10-CM | POA: Diagnosis not present

## 2019-11-07 DIAGNOSIS — G309 Alzheimer's disease, unspecified: Secondary | ICD-10-CM | POA: Diagnosis not present

## 2019-11-07 DIAGNOSIS — F0280 Dementia in other diseases classified elsewhere without behavioral disturbance: Secondary | ICD-10-CM | POA: Diagnosis not present

## 2019-11-07 DIAGNOSIS — E119 Type 2 diabetes mellitus without complications: Secondary | ICD-10-CM | POA: Diagnosis not present

## 2019-11-07 DIAGNOSIS — G300 Alzheimer's disease with early onset: Secondary | ICD-10-CM | POA: Diagnosis not present

## 2019-11-07 DIAGNOSIS — E785 Hyperlipidemia, unspecified: Secondary | ICD-10-CM | POA: Diagnosis not present

## 2019-11-07 DIAGNOSIS — F331 Major depressive disorder, recurrent, moderate: Secondary | ICD-10-CM | POA: Diagnosis not present

## 2019-11-10 DIAGNOSIS — F0280 Dementia in other diseases classified elsewhere without behavioral disturbance: Secondary | ICD-10-CM | POA: Diagnosis not present

## 2019-11-10 DIAGNOSIS — R1310 Dysphagia, unspecified: Secondary | ICD-10-CM | POA: Diagnosis not present

## 2019-11-10 DIAGNOSIS — E119 Type 2 diabetes mellitus without complications: Secondary | ICD-10-CM | POA: Diagnosis not present

## 2019-11-10 DIAGNOSIS — E785 Hyperlipidemia, unspecified: Secondary | ICD-10-CM | POA: Diagnosis not present

## 2019-11-10 DIAGNOSIS — G309 Alzheimer's disease, unspecified: Secondary | ICD-10-CM | POA: Diagnosis not present

## 2019-11-10 DIAGNOSIS — M1990 Unspecified osteoarthritis, unspecified site: Secondary | ICD-10-CM | POA: Diagnosis not present

## 2019-11-11 DIAGNOSIS — R1310 Dysphagia, unspecified: Secondary | ICD-10-CM | POA: Diagnosis not present

## 2019-11-11 DIAGNOSIS — M1990 Unspecified osteoarthritis, unspecified site: Secondary | ICD-10-CM | POA: Diagnosis not present

## 2019-11-11 DIAGNOSIS — E785 Hyperlipidemia, unspecified: Secondary | ICD-10-CM | POA: Diagnosis not present

## 2019-11-11 DIAGNOSIS — G309 Alzheimer's disease, unspecified: Secondary | ICD-10-CM | POA: Diagnosis not present

## 2019-11-11 DIAGNOSIS — F0280 Dementia in other diseases classified elsewhere without behavioral disturbance: Secondary | ICD-10-CM | POA: Diagnosis not present

## 2019-11-11 DIAGNOSIS — Z7984 Long term (current) use of oral hypoglycemic drugs: Secondary | ICD-10-CM | POA: Diagnosis not present

## 2019-11-11 DIAGNOSIS — Z9181 History of falling: Secondary | ICD-10-CM | POA: Diagnosis not present

## 2019-11-11 DIAGNOSIS — E119 Type 2 diabetes mellitus without complications: Secondary | ICD-10-CM | POA: Diagnosis not present

## 2019-11-12 DIAGNOSIS — F0280 Dementia in other diseases classified elsewhere without behavioral disturbance: Secondary | ICD-10-CM | POA: Diagnosis not present

## 2019-11-12 DIAGNOSIS — G309 Alzheimer's disease, unspecified: Secondary | ICD-10-CM | POA: Diagnosis not present

## 2019-11-12 DIAGNOSIS — E785 Hyperlipidemia, unspecified: Secondary | ICD-10-CM | POA: Diagnosis not present

## 2019-11-12 DIAGNOSIS — E119 Type 2 diabetes mellitus without complications: Secondary | ICD-10-CM | POA: Diagnosis not present

## 2019-11-12 DIAGNOSIS — R1310 Dysphagia, unspecified: Secondary | ICD-10-CM | POA: Diagnosis not present

## 2019-11-12 DIAGNOSIS — M1990 Unspecified osteoarthritis, unspecified site: Secondary | ICD-10-CM | POA: Diagnosis not present

## 2019-11-20 DIAGNOSIS — E785 Hyperlipidemia, unspecified: Secondary | ICD-10-CM | POA: Diagnosis not present

## 2019-11-20 DIAGNOSIS — M1990 Unspecified osteoarthritis, unspecified site: Secondary | ICD-10-CM | POA: Diagnosis not present

## 2019-11-20 DIAGNOSIS — R1310 Dysphagia, unspecified: Secondary | ICD-10-CM | POA: Diagnosis not present

## 2019-11-20 DIAGNOSIS — F0280 Dementia in other diseases classified elsewhere without behavioral disturbance: Secondary | ICD-10-CM | POA: Diagnosis not present

## 2019-11-20 DIAGNOSIS — G309 Alzheimer's disease, unspecified: Secondary | ICD-10-CM | POA: Diagnosis not present

## 2019-11-20 DIAGNOSIS — E119 Type 2 diabetes mellitus without complications: Secondary | ICD-10-CM | POA: Diagnosis not present

## 2019-11-24 DIAGNOSIS — F331 Major depressive disorder, recurrent, moderate: Secondary | ICD-10-CM | POA: Diagnosis not present

## 2019-11-24 DIAGNOSIS — G309 Alzheimer's disease, unspecified: Secondary | ICD-10-CM | POA: Diagnosis not present

## 2019-11-24 DIAGNOSIS — R1310 Dysphagia, unspecified: Secondary | ICD-10-CM | POA: Diagnosis not present

## 2019-11-24 DIAGNOSIS — M1990 Unspecified osteoarthritis, unspecified site: Secondary | ICD-10-CM | POA: Diagnosis not present

## 2019-11-24 DIAGNOSIS — E119 Type 2 diabetes mellitus without complications: Secondary | ICD-10-CM | POA: Diagnosis not present

## 2019-11-24 DIAGNOSIS — E785 Hyperlipidemia, unspecified: Secondary | ICD-10-CM | POA: Diagnosis not present

## 2019-11-24 DIAGNOSIS — F0280 Dementia in other diseases classified elsewhere without behavioral disturbance: Secondary | ICD-10-CM | POA: Diagnosis not present

## 2019-11-26 DIAGNOSIS — R1310 Dysphagia, unspecified: Secondary | ICD-10-CM | POA: Diagnosis not present

## 2019-11-26 DIAGNOSIS — E119 Type 2 diabetes mellitus without complications: Secondary | ICD-10-CM | POA: Diagnosis not present

## 2019-11-26 DIAGNOSIS — M1990 Unspecified osteoarthritis, unspecified site: Secondary | ICD-10-CM | POA: Diagnosis not present

## 2019-11-26 DIAGNOSIS — G309 Alzheimer's disease, unspecified: Secondary | ICD-10-CM | POA: Diagnosis not present

## 2019-11-26 DIAGNOSIS — E785 Hyperlipidemia, unspecified: Secondary | ICD-10-CM | POA: Diagnosis not present

## 2019-11-26 DIAGNOSIS — F0280 Dementia in other diseases classified elsewhere without behavioral disturbance: Secondary | ICD-10-CM | POA: Diagnosis not present

## 2019-12-02 DIAGNOSIS — G300 Alzheimer's disease with early onset: Secondary | ICD-10-CM | POA: Diagnosis not present

## 2019-12-02 DIAGNOSIS — E119 Type 2 diabetes mellitus without complications: Secondary | ICD-10-CM | POA: Diagnosis not present

## 2019-12-02 DIAGNOSIS — E782 Mixed hyperlipidemia: Secondary | ICD-10-CM | POA: Diagnosis not present

## 2019-12-02 DIAGNOSIS — F0281 Dementia in other diseases classified elsewhere with behavioral disturbance: Secondary | ICD-10-CM | POA: Diagnosis not present

## 2019-12-02 DIAGNOSIS — Z7984 Long term (current) use of oral hypoglycemic drugs: Secondary | ICD-10-CM | POA: Diagnosis not present

## 2019-12-04 DIAGNOSIS — G309 Alzheimer's disease, unspecified: Secondary | ICD-10-CM | POA: Diagnosis not present

## 2019-12-04 DIAGNOSIS — R1310 Dysphagia, unspecified: Secondary | ICD-10-CM | POA: Diagnosis not present

## 2019-12-04 DIAGNOSIS — E785 Hyperlipidemia, unspecified: Secondary | ICD-10-CM | POA: Diagnosis not present

## 2019-12-04 DIAGNOSIS — M1990 Unspecified osteoarthritis, unspecified site: Secondary | ICD-10-CM | POA: Diagnosis not present

## 2019-12-04 DIAGNOSIS — E119 Type 2 diabetes mellitus without complications: Secondary | ICD-10-CM | POA: Diagnosis not present

## 2019-12-04 DIAGNOSIS — F0280 Dementia in other diseases classified elsewhere without behavioral disturbance: Secondary | ICD-10-CM | POA: Diagnosis not present

## 2019-12-05 DIAGNOSIS — F0281 Dementia in other diseases classified elsewhere with behavioral disturbance: Secondary | ICD-10-CM | POA: Diagnosis not present

## 2019-12-05 DIAGNOSIS — F331 Major depressive disorder, recurrent, moderate: Secondary | ICD-10-CM | POA: Diagnosis not present

## 2019-12-05 DIAGNOSIS — G300 Alzheimer's disease with early onset: Secondary | ICD-10-CM | POA: Diagnosis not present

## 2019-12-07 DIAGNOSIS — F331 Major depressive disorder, recurrent, moderate: Secondary | ICD-10-CM | POA: Diagnosis not present

## 2019-12-08 DIAGNOSIS — R05 Cough: Secondary | ICD-10-CM | POA: Diagnosis not present

## 2019-12-09 DIAGNOSIS — Z23 Encounter for immunization: Secondary | ICD-10-CM | POA: Diagnosis not present

## 2019-12-09 DIAGNOSIS — R1310 Dysphagia, unspecified: Secondary | ICD-10-CM | POA: Diagnosis not present

## 2019-12-09 DIAGNOSIS — G309 Alzheimer's disease, unspecified: Secondary | ICD-10-CM | POA: Diagnosis not present

## 2019-12-09 DIAGNOSIS — F0280 Dementia in other diseases classified elsewhere without behavioral disturbance: Secondary | ICD-10-CM | POA: Diagnosis not present

## 2019-12-11 DIAGNOSIS — R1310 Dysphagia, unspecified: Secondary | ICD-10-CM | POA: Diagnosis not present

## 2019-12-11 DIAGNOSIS — Z9181 History of falling: Secondary | ICD-10-CM | POA: Diagnosis not present

## 2019-12-11 DIAGNOSIS — G309 Alzheimer's disease, unspecified: Secondary | ICD-10-CM | POA: Diagnosis not present

## 2019-12-11 DIAGNOSIS — E782 Mixed hyperlipidemia: Secondary | ICD-10-CM | POA: Diagnosis not present

## 2019-12-11 DIAGNOSIS — E785 Hyperlipidemia, unspecified: Secondary | ICD-10-CM | POA: Diagnosis not present

## 2019-12-11 DIAGNOSIS — M1990 Unspecified osteoarthritis, unspecified site: Secondary | ICD-10-CM | POA: Diagnosis not present

## 2019-12-11 DIAGNOSIS — E119 Type 2 diabetes mellitus without complications: Secondary | ICD-10-CM | POA: Diagnosis not present

## 2019-12-11 DIAGNOSIS — Z7984 Long term (current) use of oral hypoglycemic drugs: Secondary | ICD-10-CM | POA: Diagnosis not present

## 2019-12-11 DIAGNOSIS — F0280 Dementia in other diseases classified elsewhere without behavioral disturbance: Secondary | ICD-10-CM | POA: Diagnosis not present

## 2019-12-18 DIAGNOSIS — M1990 Unspecified osteoarthritis, unspecified site: Secondary | ICD-10-CM | POA: Diagnosis not present

## 2019-12-18 DIAGNOSIS — E119 Type 2 diabetes mellitus without complications: Secondary | ICD-10-CM | POA: Diagnosis not present

## 2019-12-18 DIAGNOSIS — R1310 Dysphagia, unspecified: Secondary | ICD-10-CM | POA: Diagnosis not present

## 2019-12-18 DIAGNOSIS — G309 Alzheimer's disease, unspecified: Secondary | ICD-10-CM | POA: Diagnosis not present

## 2019-12-18 DIAGNOSIS — E785 Hyperlipidemia, unspecified: Secondary | ICD-10-CM | POA: Diagnosis not present

## 2019-12-18 DIAGNOSIS — F0280 Dementia in other diseases classified elsewhere without behavioral disturbance: Secondary | ICD-10-CM | POA: Diagnosis not present

## 2019-12-23 ENCOUNTER — Telehealth: Payer: Self-pay

## 2019-12-23 DIAGNOSIS — R112 Nausea with vomiting, unspecified: Secondary | ICD-10-CM

## 2019-12-23 NOTE — Telephone Encounter (Signed)
Barium esophagram scheduled for 01/09/20 11 am [facility] NPO 3 hours   The pt's daughter [name] has been advised and instructed.  She will call if there are any issues.

## 2019-12-23 NOTE — Telephone Encounter (Signed)
-----   Message from Steven Banister, MD sent at 12/23/2019  6:08 AM EDT ----- I think ordering the barium esophagram is a good idea. Thanks  ----- Message ----- From: Jerene Bears, MD Sent: 12/22/2019  11:25 AM EDT To: Steven Banister, MD, Timothy Lasso, RN  [name], This is a patient of yours.  This is Steven Kline.  He has fairly significant dementia and is in a memory care unit.   She asked for my opinion while I was in endoscopy today at [facility].He has had vomiting postprandially over the last month. Apparently speech and swallow therapy saw him and could not elicit any specific abnormality. They were trying to be very conservative and have him on scheduled Zofran which is helped somewhat. She asked for my opinion. Certainly will defer this to you, but may be best to have him do a barium esophagram and upper GI series to see if there is anything overt causing his symptoms. I told her I would discuss with you when if you are comfortable we may just order the imaging rather than schedule an office visit given the limitations of his dementia and the fact that he lives in a nursing facility. Thanks Ulice Dash

## 2019-12-26 DIAGNOSIS — E119 Type 2 diabetes mellitus without complications: Secondary | ICD-10-CM | POA: Diagnosis not present

## 2019-12-26 DIAGNOSIS — G300 Alzheimer's disease with early onset: Secondary | ICD-10-CM | POA: Diagnosis not present

## 2019-12-26 DIAGNOSIS — E782 Mixed hyperlipidemia: Secondary | ICD-10-CM | POA: Diagnosis not present

## 2019-12-29 ENCOUNTER — Telehealth: Payer: Self-pay

## 2019-12-29 DIAGNOSIS — E782 Mixed hyperlipidemia: Secondary | ICD-10-CM | POA: Diagnosis not present

## 2019-12-29 DIAGNOSIS — E119 Type 2 diabetes mellitus without complications: Secondary | ICD-10-CM | POA: Diagnosis not present

## 2019-12-29 DIAGNOSIS — F0281 Dementia in other diseases classified elsewhere with behavioral disturbance: Secondary | ICD-10-CM | POA: Diagnosis not present

## 2019-12-29 DIAGNOSIS — G300 Alzheimer's disease with early onset: Secondary | ICD-10-CM | POA: Diagnosis not present

## 2019-12-29 DIAGNOSIS — W19XXXA Unspecified fall, initial encounter: Secondary | ICD-10-CM | POA: Diagnosis not present

## 2019-12-29 DIAGNOSIS — Z7984 Long term (current) use of oral hypoglycemic drugs: Secondary | ICD-10-CM | POA: Diagnosis not present

## 2019-12-29 DIAGNOSIS — R112 Nausea with vomiting, unspecified: Secondary | ICD-10-CM

## 2019-12-29 NOTE — Telephone Encounter (Signed)
The pt daughter has been advised

## 2019-12-29 NOTE — Telephone Encounter (Signed)
-----   Message from Jerene Bears, MD sent at 12/27/2019  9:17 AM EDT ----- Chong Sicilian, I think this patient needs UGI series in addition to esophagram.  He is vomiting Thanks JMP

## 2019-12-29 NOTE — Telephone Encounter (Signed)
The pt has been scheduled for 01/15/20 at 930 am at [facility] nothing to eat or drink after midnight arrive at 915 am

## 2020-01-02 DIAGNOSIS — F0281 Dementia in other diseases classified elsewhere with behavioral disturbance: Secondary | ICD-10-CM | POA: Diagnosis not present

## 2020-01-02 DIAGNOSIS — G300 Alzheimer's disease with early onset: Secondary | ICD-10-CM | POA: Diagnosis not present

## 2020-01-02 DIAGNOSIS — F331 Major depressive disorder, recurrent, moderate: Secondary | ICD-10-CM | POA: Diagnosis not present

## 2020-01-06 DIAGNOSIS — F331 Major depressive disorder, recurrent, moderate: Secondary | ICD-10-CM | POA: Diagnosis not present

## 2020-01-07 DIAGNOSIS — M159 Polyosteoarthritis, unspecified: Secondary | ICD-10-CM | POA: Diagnosis not present

## 2020-01-07 DIAGNOSIS — F321 Major depressive disorder, single episode, moderate: Secondary | ICD-10-CM | POA: Diagnosis not present

## 2020-01-09 ENCOUNTER — Ambulatory Visit (HOSPITAL_COMMUNITY): Payer: Medicare Other

## 2020-01-15 ENCOUNTER — Ambulatory Visit (HOSPITAL_COMMUNITY): Payer: Medicare Other

## 2020-01-15 DIAGNOSIS — M159 Polyosteoarthritis, unspecified: Secondary | ICD-10-CM | POA: Diagnosis not present

## 2020-01-15 DIAGNOSIS — F321 Major depressive disorder, single episode, moderate: Secondary | ICD-10-CM | POA: Diagnosis not present

## 2020-01-30 DIAGNOSIS — G300 Alzheimer's disease with early onset: Secondary | ICD-10-CM | POA: Diagnosis not present

## 2020-01-30 DIAGNOSIS — F331 Major depressive disorder, recurrent, moderate: Secondary | ICD-10-CM | POA: Diagnosis not present

## 2020-01-30 DIAGNOSIS — M79671 Pain in right foot: Secondary | ICD-10-CM | POA: Diagnosis not present

## 2020-01-30 DIAGNOSIS — F0281 Dementia in other diseases classified elsewhere with behavioral disturbance: Secondary | ICD-10-CM | POA: Diagnosis not present

## 2020-02-01 ENCOUNTER — Encounter (HOSPITAL_COMMUNITY): Payer: Self-pay | Admitting: Emergency Medicine

## 2020-02-01 ENCOUNTER — Emergency Department (HOSPITAL_COMMUNITY): Payer: Medicare Other

## 2020-02-01 ENCOUNTER — Observation Stay (HOSPITAL_COMMUNITY)
Admission: EM | Admit: 2020-02-01 | Discharge: 2020-02-02 | Disposition: A | Discharge status: Home / Self Care | Payer: Medicare Other | Attending: Internal Medicine | Admitting: Internal Medicine

## 2020-02-01 ENCOUNTER — Other Ambulatory Visit: Payer: Self-pay

## 2020-02-01 DIAGNOSIS — Z515 Encounter for palliative care: Secondary | ICD-10-CM | POA: Diagnosis not present

## 2020-02-01 DIAGNOSIS — E119 Type 2 diabetes mellitus without complications: Secondary | ICD-10-CM | POA: Diagnosis not present

## 2020-02-01 DIAGNOSIS — R0902 Hypoxemia: Secondary | ICD-10-CM | POA: Diagnosis not present

## 2020-02-01 DIAGNOSIS — I10 Essential (primary) hypertension: Secondary | ICD-10-CM | POA: Insufficient documentation

## 2020-02-01 DIAGNOSIS — Z7984 Long term (current) use of oral hypoglycemic drugs: Secondary | ICD-10-CM | POA: Insufficient documentation

## 2020-02-01 DIAGNOSIS — Z8651 Personal history of combat and operational stress reaction: Secondary | ICD-10-CM | POA: Insufficient documentation

## 2020-02-01 DIAGNOSIS — F0390 Unspecified dementia without behavioral disturbance: Secondary | ICD-10-CM | POA: Insufficient documentation

## 2020-02-01 DIAGNOSIS — F17290 Nicotine dependence, other tobacco product, uncomplicated: Secondary | ICD-10-CM | POA: Diagnosis not present

## 2020-02-01 DIAGNOSIS — Z743 Need for continuous supervision: Secondary | ICD-10-CM | POA: Diagnosis not present

## 2020-02-01 DIAGNOSIS — Z79899 Other long term (current) drug therapy: Secondary | ICD-10-CM | POA: Diagnosis not present

## 2020-02-01 DIAGNOSIS — Z20822 Contact with and (suspected) exposure to covid-19: Secondary | ICD-10-CM | POA: Diagnosis not present

## 2020-02-01 DIAGNOSIS — Z7189 Other specified counseling: Secondary | ICD-10-CM

## 2020-02-01 DIAGNOSIS — A419 Sepsis, unspecified organism: Secondary | ICD-10-CM

## 2020-02-01 DIAGNOSIS — R627 Adult failure to thrive: Principal | ICD-10-CM | POA: Insufficient documentation

## 2020-02-01 DIAGNOSIS — R0689 Other abnormalities of breathing: Secondary | ICD-10-CM | POA: Diagnosis not present

## 2020-02-01 DIAGNOSIS — Z66 Do not resuscitate: Secondary | ICD-10-CM | POA: Diagnosis not present

## 2020-02-01 DIAGNOSIS — I6389 Other cerebral infarction: Secondary | ICD-10-CM | POA: Diagnosis not present

## 2020-02-01 DIAGNOSIS — N179 Acute kidney failure, unspecified: Secondary | ICD-10-CM

## 2020-02-01 DIAGNOSIS — R52 Pain, unspecified: Secondary | ICD-10-CM | POA: Diagnosis not present

## 2020-02-01 DIAGNOSIS — R6521 Severe sepsis with septic shock: Secondary | ICD-10-CM | POA: Diagnosis not present

## 2020-02-01 DIAGNOSIS — I6782 Cerebral ischemia: Secondary | ICD-10-CM | POA: Diagnosis not present

## 2020-02-01 DIAGNOSIS — R4182 Altered mental status, unspecified: Secondary | ICD-10-CM | POA: Diagnosis not present

## 2020-02-01 DIAGNOSIS — E1165 Type 2 diabetes mellitus with hyperglycemia: Secondary | ICD-10-CM | POA: Diagnosis not present

## 2020-02-01 DIAGNOSIS — I499 Cardiac arrhythmia, unspecified: Secondary | ICD-10-CM | POA: Diagnosis not present

## 2020-02-01 DIAGNOSIS — R0602 Shortness of breath: Secondary | ICD-10-CM

## 2020-02-01 LAB — CBC WITH DIFFERENTIAL/PLATELET
Abs Immature Granulocytes: 0.12 10*3/uL — ABNORMAL HIGH (ref 0.00–0.07)
Basophils Absolute: 0 10*3/uL (ref 0.0–0.1)
Basophils Relative: 0 %
Eosinophils Absolute: 0 10*3/uL (ref 0.0–0.5)
Eosinophils Relative: 0 %
HCT: 36.5 % — ABNORMAL LOW (ref 39.0–52.0)
Hemoglobin: 10.9 g/dL — ABNORMAL LOW (ref 13.0–17.0)
Immature Granulocytes: 1 %
Lymphocytes Relative: 2 %
Lymphs Abs: 0.3 10*3/uL — ABNORMAL LOW (ref 0.7–4.0)
MCH: 27.9 pg (ref 26.0–34.0)
MCHC: 29.9 g/dL — ABNORMAL LOW (ref 30.0–36.0)
MCV: 93.4 fL (ref 80.0–100.0)
Monocytes Absolute: 0.7 10*3/uL (ref 0.1–1.0)
Monocytes Relative: 4 %
Neutro Abs: 17 10*3/uL — ABNORMAL HIGH (ref 1.7–7.7)
Neutrophils Relative %: 93 %
Platelets: 321 10*3/uL (ref 150–400)
RBC: 3.91 MIL/uL — ABNORMAL LOW (ref 4.22–5.81)
RDW: 15.1 % (ref 11.5–15.5)
WBC: 18.2 10*3/uL — ABNORMAL HIGH (ref 4.0–10.5)
nRBC: 0 % (ref 0.0–0.2)

## 2020-02-01 LAB — COMPREHENSIVE METABOLIC PANEL
ALT: 20 U/L (ref 0–44)
AST: 40 U/L (ref 15–41)
Albumin: 2.4 g/dL — ABNORMAL LOW (ref 3.5–5.0)
Alkaline Phosphatase: 113 U/L (ref 38–126)
Anion gap: 20 — ABNORMAL HIGH (ref 5–15)
BUN: 81 mg/dL — ABNORMAL HIGH (ref 8–23)
CO2: 17 mmol/L — ABNORMAL LOW (ref 22–32)
Calcium: 8.2 mg/dL — ABNORMAL LOW (ref 8.9–10.3)
Chloride: 102 mmol/L (ref 98–111)
Creatinine, Ser: 3.46 mg/dL — ABNORMAL HIGH (ref 0.61–1.24)
GFR, Estimated: 17 mL/min — ABNORMAL LOW (ref >60)
Glucose, Bld: 428 mg/dL — ABNORMAL HIGH (ref 70–99)
Potassium: 4.4 mmol/L (ref 3.5–5.1)
Sodium: 139 mmol/L (ref 135–145)
Total Bilirubin: 0.9 mg/dL (ref 0.3–1.2)
Total Protein: 5.9 g/dL — ABNORMAL LOW (ref 6.5–8.1)

## 2020-02-01 LAB — RESP PANEL BY RT-PCR (FLU A&B, COVID) ARPGX2
Influenza A by PCR: NEGATIVE
Influenza B by PCR: NEGATIVE
SARS Coronavirus 2 by RT PCR: NEGATIVE

## 2020-02-01 LAB — TSH: TSH: 5.087 u[IU]/mL — ABNORMAL HIGH (ref 0.350–4.500)

## 2020-02-01 LAB — TROPONIN I (HIGH SENSITIVITY): Troponin I (High Sensitivity): 127 ng/L (ref <18)

## 2020-02-01 LAB — CBG MONITORING, ED: Glucose-Capillary: 369 mg/dL — ABNORMAL HIGH (ref 70–99)

## 2020-02-01 LAB — T4, FREE: Free T4: 0.8 ng/dL (ref 0.61–1.12)

## 2020-02-01 MED ORDER — GLYCOPYRROLATE 1 MG PO TABS
1.0000 mg | ORAL_TABLET | ORAL | Status: DC | PRN
  Filled 2020-02-01: qty 1

## 2020-02-01 MED ORDER — HALOPERIDOL 0.5 MG PO TABS: 0.5000 mg | ORAL_TABLET | ORAL | Status: DC | PRN

## 2020-02-01 MED ORDER — GLYCOPYRROLATE 0.2 MG/ML IJ SOLN
0.2000 mg | INTRAMUSCULAR | Status: DC | PRN
  Administered 2020-02-02: 0.2 mg via INTRAVENOUS
  Filled 2020-02-01: qty 1

## 2020-02-01 MED ORDER — LORAZEPAM 1 MG PO TABS: 1.0000 mg | ORAL_TABLET | ORAL | Status: DC | PRN

## 2020-02-01 MED ORDER — ONDANSETRON HCL 4 MG/2ML IJ SOLN
4.0000 mg | Freq: Once | INTRAMUSCULAR | Status: AC
  Administered 2020-02-01: 4 mg via INTRAVENOUS
  Filled 2020-02-01: qty 2

## 2020-02-01 MED ORDER — BIOTENE DRY MOUTH MT LIQD: 15.0000 mL | OROMUCOSAL | Status: DC | PRN

## 2020-02-01 MED ORDER — GLYCOPYRROLATE 0.2 MG/ML IJ SOLN: 0.2000 mg | INTRAMUSCULAR | Status: DC | PRN

## 2020-02-01 MED ORDER — ONDANSETRON HCL 4 MG/2ML IJ SOLN: 4.0000 mg | Freq: Four times a day (QID) | INTRAMUSCULAR | Status: DC | PRN

## 2020-02-01 MED ORDER — GLYCOPYRROLATE 1 MG PO TABS: 1.0000 mg | ORAL_TABLET | ORAL | Status: DC | PRN

## 2020-02-01 MED ORDER — HALOPERIDOL LACTATE 5 MG/ML IJ SOLN: 0.5000 mg | INTRAMUSCULAR | Status: DC | PRN

## 2020-02-01 MED ORDER — ONDANSETRON 4 MG PO TBDP: 4.0000 mg | ORAL_TABLET | Freq: Four times a day (QID) | ORAL | Status: DC | PRN

## 2020-02-01 MED ORDER — ACETAMINOPHEN 325 MG PO TABS: 650.0000 mg | ORAL_TABLET | Freq: Four times a day (QID) | ORAL | Status: DC | PRN

## 2020-02-01 MED ORDER — LORAZEPAM 2 MG/ML IJ SOLN: 1.0000 mg | INTRAMUSCULAR | Status: DC | PRN

## 2020-02-01 MED ORDER — HYDROMORPHONE HCL 1 MG/ML IJ SOLN
0.5000 mg | INTRAMUSCULAR | Status: DC | PRN
  Administered 2020-02-01: 0.5 mg via INTRAVENOUS
  Filled 2020-02-01: qty 1

## 2020-02-01 MED ORDER — MORPHINE SULFATE (PF) 2 MG/ML IV SOLN
1.0000 mg | INTRAVENOUS | Status: DC | PRN
  Administered 2020-02-01 – 2020-02-02 (×3): 1 mg via INTRAVENOUS
  Filled 2020-02-01 (×3): qty 1

## 2020-02-01 MED ORDER — LORAZEPAM 2 MG/ML PO CONC: 1.0000 mg | ORAL | Status: DC | PRN

## 2020-02-01 MED ORDER — SODIUM CHLORIDE 0.9 % IV BOLUS
1000.0000 mL | Freq: Once | INTRAVENOUS | Status: AC
  Administered 2020-02-01: 1000 mL via INTRAVENOUS

## 2020-02-01 MED ORDER — ACETAMINOPHEN 650 MG RE SUPP
650.0000 mg | Freq: Four times a day (QID) | RECTAL | Status: DC | PRN
  Administered 2020-02-02: 650 mg via RECTAL
  Filled 2020-02-01: qty 1

## 2020-02-01 MED ORDER — HALOPERIDOL LACTATE 2 MG/ML PO CONC
0.5000 mg | ORAL | Status: DC | PRN
  Filled 2020-02-01: qty 0.3

## 2020-02-01 MED ORDER — MORPHINE SULFATE (PF) 4 MG/ML IV SOLN
4.0000 mg | Freq: Once | INTRAVENOUS | Status: AC
  Administered 2020-02-01: 4 mg via INTRAVENOUS
  Filled 2020-02-01: qty 1

## 2020-02-01 MED ORDER — LORAZEPAM 2 MG/ML PO CONC
1.0000 mg | ORAL | Status: DC | PRN
  Filled 2020-02-01: qty 0.5

## 2020-02-01 MED ORDER — POLYVINYL ALCOHOL 1.4 % OP SOLN
1.0000 [drp] | Freq: Four times a day (QID) | OPHTHALMIC | Status: DC | PRN
  Filled 2020-02-01: qty 15

## 2020-02-01 NOTE — ED Provider Notes (Signed)
[facility] DEPT Provider Note   CSN: [id number] Arrival date & time: 01/23/2020  1152     History Chief Complaint  Patient presents with  . Weakness    Steven Kline is a 79 y.o. male.  79 yo M with a chief complaints of failure to thrive.  This been going on for weeks now.  Family states that he has had very progressive dementia.  Has gotten to the point where he is not really been eating and drinking.  Was noted today to be quite confused.  Having some coughing as well.  Found to be hypoxic with EMS down into the 70s.  Placed on 6 L nasal cannula with some improvement.  Patient is confused and unable to provide further history.  Level 5 caveat altered mental status.  The history is provided by the patient and a relative.       Past Medical History:  Diagnosis Date  . Atrial fibrillation ([place])   . Baker's cyst of knee   . Bladder cancer ([place])    history of (refused preventative treatments)  . Frequency of urination   . Hematuria   . History of gastric ulcer    s/p  exp. lap.  repair perforated gastric ulcer  . History of stress test    per pt had test done this year 2018 approx. in spring or early summer-- pt does not remember who or where  . Hyperlipidemia   . Hypertension   . Nocturia   . OSA (obstructive sleep apnea)    per pt no longer using cpap---  remote hx sleep apnea dx  . Type 2 diabetes mellitus [facility])     Patient Active Problem List   Diagnosis Date Noted  . Change in bowel habits 08/28/2017    Past Surgical History:  Procedure Laterality Date  . COLONOSCOPY  last one 03-01-2012  . INGUINAL HERNIA REPAIR Left 1960  . REPAIR OF PERFORATED ULCER  09-14-2015   at [facility], MD   via explor. lap.  . TRANSURETHRAL RESECTION OF BLADDER TUMOR N/A 11/06/2016   Procedure: TRANSURETHRAL RESECTION OF BLADDER TUMOR (TURBT), MEATAL DILITATION;  Surgeon: [name], MD;  Location: [facility];  Service:  Urology;  Laterality: N/A;  . TRANSURETHRAL RESECTION OF BLADDER TUMOR N/A 06/06/2017   Procedure: TRANSURETHRAL RESECTION OF BLADDER TUMOR (TURBT);  Surgeon: [name], MD;  Location: [facility];  Service: Urology;  Laterality: N/A;  . UVULOPALATOPHARYNGOPLASTY  yrs ago (date?)   [facility]       Family History  Problem Relation Age of Onset  . Lung cancer Mother        smoker  . Cancer Father   . Heart Problems Father   . Dementia Father   . Stroke Sister   . Colon cancer Neg Hx   . Stomach cancer Neg Hx     Social History   Tobacco Use  . Smoking status: Current Every Day Smoker    Years: 63.00    Types: Cigars  . Smokeless tobacco: Never Used  . Tobacco comment: smokes 6-7 cigars a day  Vaping Use  . Vaping Use: Never used  Substance Use Topics  . Alcohol use: No  . Drug use: No    Home Medications Prior to Admission medications   Medication Sig Start Date End Date Taking? Authorizing Provider  bacitracin ointment Apply 1 application topically 2 (two) times daily. Change dressing twice daily, apply bacitracin with each  dressing change. 09/18/19   Tacy Learn, PA-C  citalopram (CELEXA) 20 MG tablet Take 20 mg by mouth daily. (0800) 07/08/19   [provider]  divalproex (DEPAKOTE SPRINKLE) 125 MG capsule Take 125 mg by mouth 2 (two) times daily. Give with food or beverage. (0800 & 2000)    [provider]  donepezil (ARICEPT) 10 MG tablet Take 10 mg by mouth at bedtime. (2000) 05/20/19   [provider]  LORazepam (ATIVAN) 0.5 MG tablet Take 0.25 mg by mouth every 12 (twelve) hours as needed for anxiety (agitation).    [provider]  metFORMIN (GLUCOPHAGE) 1000 MG tablet Take 1,000 mg by mouth daily. (0800) 02/16/18   [provider]  Nutritional Supplements (ENSURE NUTRITION SHAKE PO) Take 1 Container by mouth 2 (two) times daily.    [provider]  pravastatin (PRAVACHOL) 80 MG tablet Take 80  mg by mouth daily. (0800)    [provider]  Probiotic Product (PROBIOTIC PO) Take 1 capsule by mouth daily. (0800)    [provider]  Wheat Dextrin (BENEFIBER PO) Take 1 packet by mouth daily. Dissolve in 8 ounces of liquid. (0800)    [provider]    Allergies    Seroquel [quetiapine]  Review of Systems   Review of Systems  Unable to perform ROS: Mental status change    Physical Exam Updated Vital Signs BP (!) 97/59   Pulse 100   Temp (!) 94.6 F (34.8 C)   Resp (!) 29   SpO2 95%   Physical Exam Vitals and nursing note reviewed.  Constitutional:      Appearance: He is well-developed.     Comments: Cachectic  HENT:     Head: Normocephalic and atraumatic.  Eyes:     Pupils: Pupils are equal, round, and reactive to light.  Neck:     Vascular: No JVD.  Cardiovascular:     Rate and Rhythm: Normal rate and regular rhythm.     Heart sounds: No murmur heard.  No friction rub. No gallop.   Pulmonary:     Effort: No respiratory distress.     Breath sounds: No wheezing.  Abdominal:     General: There is no distension.     Tenderness: There is no abdominal tenderness. There is no guarding or rebound.  Musculoskeletal:        General: Normal range of motion.     Cervical back: Normal range of motion and neck supple.     Comments: Superficial ulceration to the right second digit on the dorsal aspect along with the PIP.  No obvious extension to the bone.  No surrounding erythema or warmth.  Extremities are cool diffusely.  Skin:    Coloration: Skin is not pale.     Findings: No rash.  Neurological:     Mental Status: He is alert and oriented to person, place, and time.  Psychiatric:        Behavior: Behavior normal.     ED Results / Procedures / Treatments   Labs (all labs ordered are listed, but only abnormal results are displayed) Labs Reviewed  CBC WITH DIFFERENTIAL/PLATELET - Abnormal; Notable for the following components:       Result Value   WBC 18.2 (*)    RBC 3.91 (*)    Hemoglobin 10.9 (*)    HCT 36.5 (*)    MCHC 29.9 (*)    Neutro Abs 17.0 (*)    Lymphs Abs 0.3 (*)  Abs Immature Granulocytes 0.12 (*)    All other components within normal limits  COMPREHENSIVE METABOLIC PANEL - Abnormal; Notable for the following components:   CO2 17 (*)    Glucose, Bld 428 (*)    BUN 81 (*)    Creatinine, Ser 3.46 (*)    Calcium 8.2 (*)    Total Protein 5.9 (*)    Albumin 2.4 (*)    GFR, Estimated 17 (*)    Anion gap 20 (*)    All other components within normal limits  TSH - Abnormal; Notable for the following components:   TSH 5.087 (*)    All other components within normal limits  CBG MONITORING, ED - Abnormal; Notable for the following components:   Glucose-Capillary 369 (*)    All other components within normal limits  TROPONIN I (HIGH SENSITIVITY) - Abnormal; Notable for the following components:   Troponin I (High Sensitivity) 127 (*)    All other components within normal limits  URINALYSIS, ROUTINE W REFLEX MICROSCOPIC  T4, FREE    EKG None  Radiology CT Head Wo Contrast  Result Date: 01/20/2020 CLINICAL DATA:  Mental status change. EXAM: CT HEAD WITHOUT CONTRAST TECHNIQUE: Contiguous axial images were obtained from the base of the skull through the vertex without intravenous contrast. COMPARISON:  11/02/2019 FINDINGS: Brain: Ventricles and cisterns are normal. There is prominence of the CSF spaces compatible with age related atrophy unchanged. There is moderate chronic ischemic microvascular disease left worse than right. Few small scattered small areas of old infarcts of the left hemisphere. No focal mass, mass effect or shift of midline structures. No evidence of acute hemorrhage. No evidence of acute infarction. Vascular: No hyperdense vessel or unexpected calcification. Skull: Normal. Negative for fracture or focal lesion. Sinuses/Orbits: No acute finding. Other: None. IMPRESSION: 1. No acute  findings. 2. 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 Electronically Signed   By: Marin Olp M.D.   On: 01/20/2020 14:36   DG Chest Port 1 View  Result Date: 01/27/2020 CLINICAL DATA:  Hypoxia EXAM: PORTABLE CHEST 1 VIEW COMPARISON:  08/24/2011 FINDINGS: The heart size and mediastinal contours are within normal limits. Atherosclerotic calcification of the aortic knob. Bilateral perihilar and bibasilar interstitial opacities, right worse than left. No pleural effusion. No pneumothorax. The visualized skeletal structures are unremarkable. IMPRESSION: Bilateral perihilar and bibasilar interstitial opacities, right worse than left. Findings may represent edema versus atypical/viral infection. Electronically Signed   By: Davina Poke D.O.   On: 02/01/2020 14:24    Procedures Procedures (including critical care time)  Medications Ordered in ED Medications  HYDROmorphone (DILAUDID) injection 0.5 mg (has no administration in time range)  LORazepam (ATIVAN) tablet 1 mg (has no administration in time range)    Or  LORazepam (ATIVAN) 2 MG/ML concentrated solution 1 mg (has no administration in time range)    Or  LORazepam (ATIVAN) injection 1 mg (has no administration in time range)  haloperidol (HALDOL) tablet 0.5 mg (has no administration in time range)    Or  haloperidol (HALDOL) 2 MG/ML solution 0.5 mg (has no administration in time range)    Or  haloperidol lactate (HALDOL) injection 0.5 mg (has no administration in time range)  ondansetron (ZOFRAN-ODT) disintegrating tablet 4 mg (has no administration in time range)    Or  ondansetron (ZOFRAN) injection 4 mg (has no administration in time range)  glycopyrrolate (ROBINUL) tablet 1 mg (has no administration in time range)  Or  glycopyrrolate (ROBINUL) injection 0.2 mg (has no administration in time range)    Or  glycopyrrolate (ROBINUL) injection 0.2 mg (has no  administration in time range)  sodium chloride 0.9 % bolus 1,000 mL (0 mLs Intravenous Stopped 01/15/2020 1430)  morphine 4 MG/ML injection 4 mg (4 mg Intravenous Given 01/15/2020 1315)  ondansetron (ZOFRAN) injection 4 mg (4 mg Intravenous Given 01/14/2020 1315)    ED Course  I have reviewed the triage vital signs and the nursing notes.  Pertinent labs & imaging results that were available during my care of the patient were reviewed by me and considered in my medical decision making (see chart for details).    MDM Rules/Calculators/A&P                          79 yo M with a chief complaints of failure to thrive.  Going on for some weeks now.  Seen by family last week and it was not doing well but when they visited again today he had been gotten much worse.  Not really eating or drinking for the past couple weeks.  Patient with cachexia, appears to be quite ill.  Initial rectal temperature 94.  Started on [facility].  IV fluids.  Family currently would like an evaluation but feel that it may be time for comfort care.  Patient found to likely have aspiration pneumonia.  CT of the head is negative.  Troponin is positive.  I discussed the results with the patient and family.  Discussed about starting antibiotics.  Family at this point feels that they would like to go comfort care.  Social work has evaluated today and unfortunately they are unable to go directly to hospice.  Requesting overnight stay in the hospital for symptom control.  Will discuss with the hospitalist.  CRITICAL CARE Performed by: Cecilio Asper   Total critical care time: 35 minutes  Critical care time was exclusive of separately billable procedures and treating other patients.  Critical care was necessary to treat or prevent imminent or life-threatening deterioration.  Critical care was time spent personally by me on the following activities: development of treatment plan with patient and/or surrogate as well as  nursing, discussions with consultants, evaluation of patient's response to treatment, examination of patient, obtaining history from patient or surrogate, ordering and performing treatments and interventions, ordering and review of laboratory studies, ordering and review of radiographic studies, pulse oximetry and re-evaluation of patient's condition.  The patients results and plan were reviewed and discussed.   Any x-rays performed were independently reviewed by myself.   Differential diagnosis were considered with the presenting HPI.  Medications  HYDROmorphone (DILAUDID) injection 0.5 mg (has no administration in time range)  LORazepam (ATIVAN) tablet 1 mg (has no administration in time range)    Or  LORazepam (ATIVAN) 2 MG/ML concentrated solution 1 mg (has no administration in time range)    Or  LORazepam (ATIVAN) injection 1 mg (has no administration in time range)  haloperidol (HALDOL) tablet 0.5 mg (has no administration in time range)    Or  haloperidol (HALDOL) 2 MG/ML solution 0.5 mg (has no administration in time range)    Or  haloperidol lactate (HALDOL) injection 0.5 mg (has no administration in time range)  ondansetron (ZOFRAN-ODT) disintegrating tablet 4 mg (has no administration in time range)    Or  ondansetron (ZOFRAN) injection 4 mg (has no administration in time range)  glycopyrrolate (ROBINUL)  tablet 1 mg (has no administration in time range)    Or  glycopyrrolate (ROBINUL) injection 0.2 mg (has no administration in time range)    Or  glycopyrrolate (ROBINUL) injection 0.2 mg (has no administration in time range)  sodium chloride 0.9 % bolus 1,000 mL (0 mLs Intravenous Stopped 01/18/2020 1430)  morphine 4 MG/ML injection 4 mg (4 mg Intravenous Given 01/22/2020 1315)  ondansetron (ZOFRAN) injection 4 mg (4 mg Intravenous Given 01/30/2020 1315)    Vitals:   02/10/2020 1219 02/06/2020 1300 02/03/2020 1330 01/14/2020 1400  BP: 114/66  (!) 92/56 (!) 97/59  Pulse: (!) 102  92 100   Resp: (!) 26  (!) 34 (!) 29  Temp:  (!) 94.6 F (34.8 C)    SpO2: 93%  90% 95%    Final diagnoses:  Failure to thrive in adult    Admission/ observation were discussed with the admitting physician, patient and/or family and they are comfortable with the plan.   Final Clinical Impression(s) / ED Diagnoses Final diagnoses:  Failure to thrive in adult    Rx / DC Orders ED Discharge Orders    None       Deno Etienne, DO 01/13/2020 1505

## 2020-02-01 NOTE — Progress Notes (Signed)
Manufacturing engineer [facility]) Hospital Liaison note.    Received request from [place] for family interest in [facility]. [facility] is unable to offer a room today. Hospital Liaison will follow up tomorrow or sooner if a room becomes available.    Spoke with daughter [name] by phone who shared that she is seeking comfort care for her dad; believing that he is at his end of life she wants to focus on his comfort and peace.  Contact numbers for hospital liaison provided; questions answered at this time.  Dr. Tyrone Nine made aware.  Please do not hesitate to call with questions. Thank you for the opportunity to participate in this patient's care.  Domenic Moras, BSN, RN [facility] Liaison (listed on [facility] under Hospice/[facility])    [phone] (24h on call)

## 2020-02-01 NOTE — Progress Notes (Signed)
..   Transition of Care [facility]) - Emergency Department Mini Assessment   Patient Details  Name: Steven Kline MRN: [id number] Date of Birth: 1940-04-22  Transition of Care [facility]) CM/SW Contact:    Gaetano Hawthorne Tarpley-Carter, [place] Phone Number: 01/24/2020, 2:56 PM   Clinical Narrative:  [facility] CM/CSW spoke with Denise/pts daughter.  [name] is currently seeking hospice care for pt.  CSW sought assistance from [facility] [phone].  [facility] will speak with pts daughter about concerns and questions.  Jalie Eiland Tarpley-Carter, MSW, LCSW-A Pronouns:  She, Her, Hers                  [facility] ED Transitions of CareClinical Social Worker [email or web address] [phone]  ED Mini Assessment: What brought you to the Emergency Department? : Brought in by [facility] with complaints of increased weakness x1 week.  Barriers to Discharge: No Barriers Identified     Means of departure: Not know  Interventions which prevented an admission or readmission: Other (must enter comment) [facility] Care)    Patient Contact and Communications Key Contact 1: Wandra Scot      ,     Contact Phone Number: [phone] Call outcome: Very understanding, aware, and compassionate about situation.      Choice offered to / list presented to : Adult Children  Admission diagnosis:  AMS Patient Active Problem List   Diagnosis Date Noted  . Change in bowel habits 08/28/2017   PCP:  Deland Pretty, MD Pharmacy:  No Pharmacies Listed

## 2020-02-01 NOTE — Progress Notes (Signed)
Admitted Pt to floor.  Daughter and Dr Marylyn Ishihara spoke of patient being only comfort measures.  Covered skin tears on arms/elbows and excoriated bottom with padded dressings. Pt resting peacefully.  Palliative consult in, however , after paging Dr Marylyn Ishihara, waiting for DR to put official "comfort measures" order in chart as daughter/family wishes.  Will continue to monitor.

## 2020-02-01 NOTE — ED Notes (Signed)
Bair hugger applied to pt per order.

## 2020-02-01 NOTE — ED Triage Notes (Signed)
BIBA Per EMS:  Pt coming from [facility] with complaints of increased weakness x1 week  Vitals upon arrival: 95/60 BP  120-130 HR 130/66 77% room air  20G Left forearm  431mL fluids  Vitals now:  110HR 456 CBG 97.3 26 RR 94% 6L  Hx dementia; daughter at bedside

## 2020-02-01 NOTE — H&P (Signed)
History and Physical    Steven Kline LSL:[id number] DOB: 1941/03/07 DOA: 01/15/2020  PCP: Deland Pretty, MD  Patient coming from: [facility]  Chief Complaint: Altered mental status  HPI: Steven Kline is a 79 y.o. male with medical history significant of dementia, bladder cancer, DM2, HLD. Presenting with altered mental status. History from dtr. She reports that his health has been declining over the last several months. He has become weaker (now requiring WC for mobility). He is not eating. His mentation is worsening. It seems that each time family has checked on him in the last several weeks, he has been worse. This morning family was checking on him and they found him unresponsive. He was staring off into space and speaking garbles words. They became concerned and brought him to the ED.   ED Course: Family spoke with EDP about his case. He was found to have PNA. Family has decided that they want him to be comfort care measures. SW team is trying to [facility] with [facility]. No beds available today. [facility] was called for admission.   Review of Systems:  Unable to obtained d/t mentation.   PMHx Past Medical History:  Diagnosis Date  . Atrial fibrillation ([place])   . Baker's cyst of knee   . Bladder cancer ([place])    history of (refused preventative treatments)  . Frequency of urination   . Hematuria   . History of gastric ulcer    s/p  exp. lap.  repair perforated gastric ulcer  . History of stress test    per pt had test done this year 2018 approx. in spring or early summer-- pt does not remember who or where  . Hyperlipidemia   . Hypertension   . Nocturia   . OSA (obstructive sleep apnea)    per pt no longer using cpap---  remote hx sleep apnea dx  . Type 2 diabetes mellitus ([place])     PSHx Past Surgical History:  Procedure Laterality Date  . COLONOSCOPY  last one 03-01-2012  . INGUINAL HERNIA REPAIR Left 1960  . REPAIR OF PERFORATED ULCER  09-14-2015   at [facility], [place]    via explor. lap.  . TRANSURETHRAL RESECTION OF BLADDER TUMOR N/A 11/06/2016   Procedure: TRANSURETHRAL RESECTION OF BLADDER TUMOR (TURBT), MEATAL DILITATION;  Surgeon: [name], MD;  Location: [facility];  Service: Urology;  Laterality: N/A;  . TRANSURETHRAL RESECTION OF BLADDER TUMOR N/A 06/06/2017   Procedure: TRANSURETHRAL RESECTION OF BLADDER TUMOR (TURBT);  Surgeon: [name], MD;  Location[facility];  Service: Urology;  Laterality: N/A;  . UVULOPALATOPHARYNGOPLASTY  yrs ago (date?)   [facility]    SocHx  reports that he has been smoking cigars. He has smoked for the past 63.00 years. He has never used smokeless tobacco. He reports that he does not drink alcohol and does not use drugs.  Allergies  Allergen Reactions  . Seroquel [Quetiapine]     "made him mean"    FamHx Family History  Problem Relation Age of Onset  . Lung cancer Mother        smoker  . Cancer Father   . Heart Problems Father   . Dementia Father   . Stroke Sister   . Colon cancer Neg Hx   . Stomach cancer Neg Hx     Prior to Admission medications   Medication Sig Start Date End Date Taking? Authorizing Provider  citalopram (CELEXA) 20 MG tablet Take 20 mg  by mouth daily. (0800) 07/08/19  Yes [provider]  divalproex (DEPAKOTE SPRINKLE) 125 MG capsule Take 250 mg by mouth 2 (two) times daily. Give with food or beverage. (1400 & 2100)   Yes [provider]  divalproex (DEPAKOTE SPRINKLE) 125 MG capsule Take 125 mg by mouth daily. Taking @ 0800   Yes [provider]  donepezil (ARICEPT) 5 MG tablet Take 5 mg by mouth at bedtime. (2000) 05/20/19  Yes [provider]  esomeprazole (NEXIUM) 20 MG capsule Take 20 mg by mouth daily at 12 noon.   Yes [provider]  loratadine (CLARITIN) 10 MG tablet Take 10 mg by mouth daily.   Yes [provider]  LORazepam (ATIVAN) 0.5 MG tablet Take 0.25 mg by mouth every 6 (six)  hours as needed for anxiety (agitation).    Yes [provider]  metFORMIN (GLUCOPHAGE) 1000 MG tablet Take 1,000 mg by mouth daily. (0800) 02/16/18  Yes [provider]  mirtazapine (REMERON) 7.5 MG tablet Take 7.5 mg by mouth at bedtime. 01/29/20  Yes [provider]  Nutritional Supplements (ENSURE NUTRITION SHAKE PO) Take 1 Container by mouth in the morning, at noon, and at bedtime. Glucerna   Yes [provider]  ondansetron (ZOFRAN) 4 MG tablet Take 4 mg by mouth every 6 (six) hours as needed for nausea/vomiting. 01/06/20  Yes [provider]  pravastatin (PRAVACHOL) 80 MG tablet Take 80 mg by mouth daily. (0800)   Yes [provider]  Probiotic Product (PROBIOTIC PO) Take 1 capsule by mouth daily. (0800)   Yes [provider]  Wheat Dextrin (BENEFIBER PO) Take 1 packet by mouth daily. Dissolve in 8 ounces of liquid. (0800)   Yes [provider]  bacitracin ointment Apply 1 application topically 2 (two) times daily. Change dressing twice daily, apply bacitracin with each dressing change. Patient not taking: Reported on 01/31/2020 09/18/19   Tacy Learn, PA-C    Physical Exam: Vitals:   02/08/2020 1500 01/22/2020 1530 02/06/2020 1545 01/14/2020 1700  BP:  95/66  97/62  Pulse:  (!) 103  95  Resp:  19  (!) 26  Temp: (!) 96 F (35.6 C)     TempSrc: Rectal     SpO2:  90% 90% 92%    General: 79 y.o. ill appearing male resting in bed Eyes: PERRL, normal sclera ENMT: Nares patent w/o discharge, orophaynx clear, ears w/o discharge/lesions/ulcers Neck: trachea midline Cardiovascular: tachy, +S1, S2, no m/g/r, equal pulses throughout Respiratory: shallow breathing, decreased at bases, increased WOB on 5 - 6L [place] GI: BS hypoactive, cachetic, NT, no masses noted, no organomegaly noted MSK: No e/c/c Neuro: A&O x name, otherwise not following commands  Labs on Admission: I have personally reviewed following labs and imaging  studies  CBC: Recent Labs  Lab 01/12/2020 1310  WBC 18.2*  NEUTROABS 17.0*  HGB 10.9*  HCT 36.5*  MCV 93.4  PLT 734   Basic Metabolic Panel: Recent Labs  Lab 01/31/2020 1310  NA 139  K 4.4  CL 102  CO2 17*  GLUCOSE 428*  BUN 81*  CREATININE 3.46*  CALCIUM 8.2*   GFR: CrCl cannot be calculated (Unknown ideal weight.). Liver Function Tests: Recent Labs  Lab 01/22/2020 1310  AST 40  ALT 20  ALKPHOS 113  BILITOT 0.9  PROT 5.9*  ALBUMIN 2.4*   No results for input(s): LIPASE, AMYLASE in the last 168 hours. No results for input(s): AMMONIA in the last 168 hours.  Coagulation Profile: No results for input(s): INR, PROTIME in the last 168 hours. Cardiac Enzymes: No results for input(s): CKTOTAL, CKMB, CKMBINDEX, TROPONINI in the last 168 hours. BNP (last 3 results) No results for input(s): PROBNP in the last 8760 hours. HbA1C: No results for input(s): HGBA1C in the last 72 hours. CBG: Recent Labs  Lab 01/17/2020 1253  GLUCAP 369*   Lipid Profile: No results for input(s): CHOL, HDL, LDLCALC, TRIG, CHOLHDL, LDLDIRECT in the last 72 hours. Thyroid Function Tests: Recent Labs    01/31/2020 1310  TSH 5.087*  FREET4 0.80   Anemia Panel: No results for input(s): VITAMINB12, FOLATE, FERRITIN, TIBC, IRON, RETICCTPCT in the last 72 hours. Urine analysis: No results found for: COLORURINE, APPEARANCEUR, [place], [place], GLUCOSEU, HGBUR, BILIRUBINUR, KETONESUR, PROTEINUR, UROBILINOGEN, NITRITE, LEUKOCYTESUR  Radiological Exams on Admission: CT Head Wo Contrast  Result Date: 02/04/2020 CLINICAL DATA:  Mental status change. EXAM: CT HEAD WITHOUT CONTRAST TECHNIQUE: Contiguous axial images were obtained from the base of the skull through the vertex without intravenous contrast. COMPARISON:  11/02/2019 FINDINGS: Brain: Ventricles and cisterns are normal. There is prominence of the CSF spaces compatible with age related atrophy unchanged. There is moderate chronic ischemic  microvascular disease left worse than right. Few small scattered small areas of old infarcts of the left hemisphere. No focal mass, mass effect or shift of midline structures. No evidence of acute hemorrhage. No evidence of acute infarction. Vascular: No hyperdense vessel or unexpected calcification. Skull: Normal. Negative for fracture or focal lesion. Sinuses/Orbits: No acute finding. Other: None. IMPRESSION: 1. No acute findings. 2. 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 Electronically Signed   By: Marin Olp M.D.   On: 01/24/2020 14:36   DG Chest Port 1 View  Result Date: 01/23/2020 CLINICAL DATA:  Hypoxia EXAM: PORTABLE CHEST 1 VIEW COMPARISON:  08/24/2011 FINDINGS: The heart size and mediastinal contours are within normal limits. Atherosclerotic calcification of the aortic knob. Bilateral perihilar and bibasilar interstitial opacities, right worse than left. No pleural effusion. No pneumothorax. The visualized skeletal structures are unremarkable. IMPRESSION: Bilateral perihilar and bibasilar interstitial opacities, right worse than left. Findings may represent edema versus atypical/viral infection. Electronically Signed   By: Davina Poke D.O.   On: 01/31/2020 14:24   Assessment/Plan Sepsis secondary to pneumonia Palliative Care/Comfort care measures/DNR patient     - admit to obs, med-surg     - family has elected comfort care measures     - IV pain control, IV ativan  AKI HGMA     - as a function of sepsis and dehydration     - family has elected comfort care measures  Elevated troponins     - as a function of sepsis     - family has elected comfort care measures  Dementia     - family has elected comfort care measures, see above  HLD     - family has elected comfort care measures, see above  DM2     - family has elected comfort care measures, see above  GERD     - family has elected comfort care  measures, see above  Hx of bladder cancer     - family has elected comfort care measures, see above  DVT prophylaxis: None, comfort care  Code Status: DNR  Family Communication: with dtr by phone  Consults called: EDP spoke with hospice team   Status is: Observation  The patient remains OBS appropriate and  will d/c before 2 midnights.  Dispo: The patient is from: SNF              Anticipated d/c is to: [facility]idential hospice              Anticipated d/c date is: 1 day              Patient currently is not medically stable to d/c.  Jonnie Finner DO [facility]  If 7PM-7AM, please contact night-coverage [email or web address]  01/17/2020, 5:18 PM

## 2020-02-01 NOTE — ED Notes (Signed)
Male purewick placed on pt 

## 2020-02-02 DIAGNOSIS — Z7189 Other specified counseling: Secondary | ICD-10-CM | POA: Diagnosis not present

## 2020-02-02 DIAGNOSIS — R0602 Shortness of breath: Secondary | ICD-10-CM | POA: Diagnosis not present

## 2020-02-02 DIAGNOSIS — Z515 Encounter for palliative care: Secondary | ICD-10-CM | POA: Diagnosis not present

## 2020-02-02 DIAGNOSIS — R4182 Altered mental status, unspecified: Secondary | ICD-10-CM | POA: Diagnosis not present

## 2020-02-02 MED ORDER — HYDROMORPHONE HCL 1 MG/ML IJ SOLN
1.0000 mg | INTRAMUSCULAR | Status: AC
  Administered 2020-02-02: 1 mg via INTRAVENOUS
  Filled 2020-02-02: qty 1

## 2020-02-02 MED ORDER — ORAL CARE MOUTH RINSE
15.0000 mL | Freq: Two times a day (BID) | OROMUCOSAL | Status: DC
  Administered 2020-02-02: 15 mL via OROMUCOSAL

## 2020-02-02 MED ORDER — HYDROMORPHONE HCL 1 MG/ML IJ SOLN
1.0000 mg | INTRAMUSCULAR | Status: DC | PRN
  Administered 2020-02-02: 1 mg via INTRAVENOUS
  Filled 2020-02-02: qty 1

## 2020-02-02 MED ORDER — MORPHINE BOLUS VIA INFUSION
4.0000 mg | INTRAVENOUS | Status: DC | PRN
  Administered 2020-02-02: 4 mg via INTRAVENOUS
  Filled 2020-02-02: qty 4

## 2020-02-02 MED ORDER — MORPHINE 100MG IN NS 100ML (1MG/ML) PREMIX INFUSION
5.0000 mg/h | INTRAVENOUS | Status: DC
  Administered 2020-02-02: 5 mg/h via INTRAVENOUS
  Filled 2020-02-02: qty 100

## 2020-02-02 MED ORDER — HALOPERIDOL LACTATE 5 MG/ML IJ SOLN
5.0000 mg | Freq: Four times a day (QID) | INTRAMUSCULAR | Status: DC | PRN
  Administered 2020-02-02: 5 mg via INTRAVENOUS
  Filled 2020-02-02: qty 1

## 2020-02-02 MED ORDER — MORPHINE BOLUS VIA INFUSION
4.0000 mg | INTRAVENOUS | Status: AC
  Administered 2020-02-02: 4 mg via INTRAVENOUS
  Filled 2020-02-02: qty 4

## 2020-02-11 NOTE — Death Summary Note (Signed)
Death Summary  Steven Kline:[id number] DOB: September 29, 1940 DOA: 03/01/20  PCP: Deland Pretty, MD  Admit date: Mar 01, 2020 Date of Death: 03-03-20 Time of Death: Jun 30, 1508   History of present illness:   Steven Kline a 79 y.o.malewith medical history significant ofdementia, bladder cancer, DM2, HLD. Presenting with altered mental status. History from dtr. She reported that his health has been declining over the last several months. He has become weaker (now requiring WC for mobility). He was not eating. His mentation was worsening. It seems that each time family has checked on him in the last several weeks, he had been worse. On presentation, chest x-ray showed multifocal pneumonia.After discussion with family, he was started on full comfort care.  Started on morphine drip.  He peacefully died in the afternoon of 2020-03-03.  Final Diagnoses:  1.   Multifocal pneumonia   The results of significant diagnostics from this hospitalization (including imaging, microbiology, ancillary and laboratory) are listed below for reference.    Significant Diagnostic Studies: CT Head Wo Contrast  Result Date: 2020/03/01 CLINICAL DATA:  Mental status change. EXAM: CT HEAD WITHOUT CONTRAST TECHNIQUE: Contiguous axial images were obtained from the base of the skull through the vertex without intravenous contrast. COMPARISON:  11/02/2019 FINDINGS: Brain: Ventricles and cisterns are normal. There is prominence of the CSF spaces compatible with age related atrophy unchanged. There is moderate chronic ischemic microvascular disease left worse than right. Few small scattered small areas of old infarcts of the left hemisphere. No focal mass, mass effect or shift of midline structures. No evidence of acute hemorrhage. No evidence of acute infarction. Vascular: No hyperdense vessel or unexpected calcification. Skull: Normal. Negative for fracture or focal lesion. Sinuses/Orbits: No acute finding. Other: None.  IMPRESSION: 1. No acute findings. 2. 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 Electronically Signed   By: Marin Olp M.D.   On: Mar 01, 2020 14:36   DG Chest Port 1 View  Result Date: 03-01-20 CLINICAL DATA:  Hypoxia EXAM: PORTABLE CHEST 1 VIEW COMPARISON:  08/24/2011 FINDINGS: The heart size and mediastinal contours are within normal limits. Atherosclerotic calcification of the aortic knob. Bilateral perihilar and bibasilar interstitial opacities, right worse than left. No pleural effusion. No pneumothorax. The visualized skeletal structures are unremarkable. IMPRESSION: Bilateral perihilar and bibasilar interstitial opacities, right worse than left. Findings may represent edema versus atypical/viral infection. Electronically Signed   By: Davina Poke D.O.   On: 03-01-20 14:24    Microbiology: Recent Results (from the past 240 hour(s))  Resp Panel by RT-PCR (Flu A&B, Covid) Nasopharyngeal Swab     Status: None   Collection Time: Mar 01, 2020  4:58 PM   Specimen: Nasopharyngeal Swab; Nasopharyngeal(NP) swabs in vial transport medium  Result Value Ref Range Status   SARS Coronavirus 2 by RT PCR NEGATIVE NEGATIVE Final    Comment: (NOTE) SARS-CoV-2 target nucleic acids are NOT DETECTED.  The SARS-CoV-2 RNA is generally detectable in upper respiratory specimens during the acute phase of infection. The lowest concentration of SARS-CoV-2 viral copies this assay can detect is 138 copies/mL. A negative result does not preclude SARS-Cov-2 infection and should not be used as the sole basis for treatment or other patient management decisions. A negative result may occur with  improper specimen collection/handling, submission of specimen other than nasopharyngeal swab, presence of viral mutation(s) within the areas targeted by this assay, and inadequate number of viral copies(<138 copies/mL). A negative result must be combined  with clinical  observations, patient history, and epidemiological information. The expected result is Negative.  Fact Sheet for Patients:  EntrepreneurPulse.com.au  Fact Sheet for Healthcare Providers:  IncredibleEmployment.be  This test is no t yet approved or cleared by the [place] FDA and  has been authorized for detection and/or diagnosis of SARS-CoV-2 by FDA under an Emergency Use Authorization (EUA). This EUA will remain  in effect (meaning this test can be used) for the duration of the COVID-19 declaration under Section 564(b)(1) of the Act, 21 U.S.C.section 360bbb-3(b)(1), unless the authorization is terminated  or revoked sooner.       Influenza A by PCR NEGATIVE NEGATIVE Final   Influenza B by PCR NEGATIVE NEGATIVE Final    Comment: (NOTE) The Xpert Xpress SARS-CoV-2/FLU/RSV plus assay is intended as an aid in the diagnosis of influenza from Nasopharyngeal swab specimens and should not be used as a sole basis for treatment. Nasal washings and aspirates are unacceptable for Xpert Xpress SARS-CoV-2/FLU/RSV testing.  Fact Sheet for Patients: EntrepreneurPulse.com.au  Fact Sheet for Healthcare Providers: IncredibleEmployment.be  This test is not yet approved or cleared by the [place] FDA and has been authorized for detection and/or diagnosis of SARS-CoV-2 by FDA under an Emergency Use Authorization (EUA). This EUA will remain in effect (meaning this test can be used) for the duration of the COVID-19 declaration under Section 564(b)(1) of the Act, 21 U.S.C. section 360bbb-3(b)(1), unless the authorization is terminated or revoked.  Performed at [facility], [place] [address], [place], [place] [address]      Labs: Basic Metabolic Panel: Recent Labs  Lab 01/25/2020 1310  NA 139  K 4.4  CL 102  CO2 17*  GLUCOSE 428*  BUN 81*  CREATININE 3.46*  CALCIUM 8.2*    Liver Function Tests: Recent Labs  Lab 01/16/2020 1310  AST 40  ALT 20  ALKPHOS 113  BILITOT 0.9  PROT 5.9*  ALBUMIN 2.4*   No results for input(s): LIPASE, AMYLASE in the last 168 hours. No results for input(s): AMMONIA in the last 168 hours. CBC: Recent Labs  Lab 01/15/2020 1310  WBC 18.2*  NEUTROABS 17.0*  HGB 10.9*  HCT 36.5*  MCV 93.4  PLT 321   Cardiac Enzymes: No results for input(s): CKTOTAL, CKMB, CKMBINDEX, TROPONINI in the last 168 hours. D-Dimer No results for input(s): DDIMER in the last 72 hours. BNP: Invalid input(s): POCBNP CBG: Recent Labs  Lab 01/22/2020 1253  GLUCAP 369*   Anemia work up No results for input(s): VITAMINB12, FOLATE, FERRITIN, TIBC, IRON, RETICCTPCT in the last 72 hours. Urinalysis No results found for: COLORURINE, APPEARANCEUR, LABSPEC, [place], GLUCOSEU, HGBUR, BILIRUBINUR, KETONESUR, PROTEINUR, UROBILINOGEN, NITRITE, LEUKOCYTESUR Sepsis Labs Invalid input(s): PROCALCITONIN,  WBC,  LACTICIDVEN     SIGNED:  Shelly Coss, MD  [facility] 02/03/2020, 11:46 AM Pager [phone]  If 7PM-7AM, please contact night-coverage [email or web address] Password [email or web address]

## 2020-02-11 NOTE — Progress Notes (Cosign Needed)
Morphine Drip total left in bag 50 mL. Verified with Boone Master, RN.

## 2020-02-11 NOTE — Consult Note (Addendum)
Consultation Note Date: 12-Feb-2020   Patient Name: Steven Kline  DOB: [birth year]  MRN: [id number]  Age / Sex: 79 y.o., male  PCP: Deland Pretty, MD Referring Physician: Shelly Coss, MD  Reason for Consultation: Terminal Care  HPI/Patient Profile: 79 y.o. male  with past medical history of dementia, bladder cancer, diabetes, HLD admitted on 01/24/2020 with altered mental status and aspiration pneumonia and family have elected for comfort measures given progressing dementia.   Clinical Assessment and Goals of Care: I met today at [name] bedside with daughter, [name]. [name] continues with much air hunger although agitation greatly improved and shortness of breath somewhat improved with interventions provided by Dr. Domingo Cocking prior to my arrival. Made plans with RN for dilaudid push to see if he may obtain greater relief in symptoms from dilaudid vs morphine. Also giving Tylenol as he feels extremely warm to touch and clammy. Life review with [name] at bedside.   I returned to check on symptoms with daughter, Santiago Glad, at bedside. He appears much more comfortable as compared to earlier. We were discussing signs of progression and expectations at end of life when [name] breathing suddenly slowed significantly and a few moments later he took his last breath. Examined and auscultated no heart sounds, no pulse, no respirations with time of death 24 and daughter, Santiago Glad, at bedside notifying other family members. Explained that they may take whatever time they need to gather. Educated that hospital will notify funeral home of their choosing when they are ready and funeral home will contact them for further arrangements and planning.   Emotional support provided to grieving family at bedside.   Rest in peace.   Primary Decision Maker Daughter [name]    SUMMARY OF RECOMMENDATIONS   - Comfort care   Code  Status/Advance Care Planning:  DNR   Symptom Management:   Morphine infusion. Not providing adequate relief even after multiple boluses. Will provide dilaudid bolus.   Paradoxical effect from Ativan.   Agitation improved with haldol.   Prognosis:   Hospital death in hours to days expected.   Discharge Planning: Hospital death.      Primary Diagnoses: Present on Admission: . AMS (altered mental status)   I have reviewed the medical record, interviewed the patient and family, and examined the patient. The following aspects are pertinent.  Past Medical History:  Diagnosis Date  . Atrial fibrillation ([place])   . Baker's cyst of knee   . Bladder cancer ([place])    history of (refused preventative treatments)  . Frequency of urination   . Hematuria   . History of gastric ulcer    s/p  exp. lap.  repair perforated gastric ulcer  . History of stress test    per pt had test done this year 2018 approx. in spring or early summer-- pt does not remember who or where  . Hyperlipidemia   . Hypertension   . Nocturia   . OSA (obstructive sleep apnea)    per pt no longer using cpap---  remote hx sleep apnea dx  . Type 2 diabetes mellitus ([place])    Social History   Socioeconomic History  . Marital status: Widowed    Spouse name: Not on file  . Number of children: 3  . Years of education: Not on file  . Highest education level: Not on file  Occupational History  . Occupation: retired  Tobacco Use  . Smoking status: Current Every Day Smoker    Years: 63.00    Types: Cigars  . Smokeless tobacco: Never Used  . Tobacco comment: smokes 6-7 cigars a day  Vaping Use  . Vaping Use: Never used  Substance and Sexual Activity  . Alcohol use: No  . Drug use: No  . Sexual activity: Not on file  Other Topics Concern  . Not on file  Social History Narrative   Lives alone widowed.   Retired.  Education HS.  Children 3.  [name] and Santiago Glad daughters on [place].  Caffeine 1-2 cups daily.    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 Difficulty of Paying Living Expenses: Not on file  Food Insecurity:   . Worried About Charity fundraiser in the Last Year: Not on file  . Ran Out of Food in the Last Year: Not on file  Transportation Needs:   . Lack of Transportation (Medical): Not on file  . Lack of Transportation (Non-Medical): Not on file  Physical Activity:   . Days of Exercise per Week: Not on file  . Minutes of Exercise per Session: Not on file  Stress:   . Feeling of Stress : Not on file  Social Connections:   . Frequency of Communication with Friends and Family: Not on file  . Frequency of Social Gatherings with Friends and Family: Not on file  . Attends Religious Services: Not on file  . Active Member of Clubs or Organizations: Not on file  . Attends Archivist Meetings: Not on file  . Marital Status: Not on file   Family History  Problem Relation Age of Onset  . Lung cancer Mother        smoker  . Cancer Father   . Heart Problems Father   . Dementia Father   . Stroke Sister   . Colon cancer Neg Hx   . Stomach cancer Neg Hx    Scheduled Meds: . mouth rinse  15 mL Mouth Rinse BID   Continuous Infusions: . morphine 5 mg/hr (02/16/20 1010)   PRN Meds:.acetaminophen **OR** acetaminophen, antiseptic oral rinse, glycopyrrolate **OR** glycopyrrolate **OR** glycopyrrolate, haloperidol lactate, HYDROmorphone (DILAUDID) injection, LORazepam **OR** LORazepam **OR** LORazepam, morphine, ondansetron **OR** ondansetron (ZOFRAN) IV, polyvinyl alcohol Allergies  Allergen Reactions  . Seroquel [Quetiapine]     "made him mean"   Review of Systems  Unable to perform ROS: Acuity of condition    Physical Exam Constitutional:      General: He is in acute distress.     Appearance: He is cachectic.  Cardiovascular:     Rate and Rhythm:  Tachycardia present.  Pulmonary:     Effort: Tachypnea, accessory muscle usage and respiratory distress present.  Abdominal:     General: Abdomen is flat.  Skin:    Comments: Warm, clammy     Vital Signs: BP (!) 90/53 (BP Location: Right Arm)   Pulse (!) 112   Temp 98.8 F (  37.1 C) (Oral)   Resp (!) 28   Ht _0  (1.854 m)   Wt 57.4 kg   SpO2 92%   BMI 16.70 kg/m  Pain Scale: PAINAD POSS *See Group Information*: S-Acceptable,Sleep, easy to arouse Pain Score: Asleep   SpO2: SpO2: 92 % O2 Device:SpO2: 92 % O2 Flow Rate: .O2 Flow Rate (L/min): 5 L/min  IO: Intake/output summary:   Intake/Output Summary (Last 24 hours) at 02/03/2020 1337 Last data filed at 01/18/2020 1430 Gross per 24 hour  Intake 1000 ml  Output --  Net 1000 ml    LBM: Last BM Date: 01/24/2020 Baseline Weight: Weight: 57.4 kg Most recent weight: Weight: 57.4 kg     Palliative Assessment/Data:     Time In/Out: 1300-1330, 1500-1540 Time Total: 70 min Greater than 50%  of this time was spent counseling and coordinating care related to the above assessment and plan.  Signed by: Vinie Sill, NP Palliative Medicine Team Pager # [phone] (M-F 8a-5p) Team Phone # [phone] (Nights/Weekends)

## 2020-02-11 NOTE — Discharge Summary (Signed)
Physician Discharge Summary  Steven Kline UVO:[id number] DOB: [birth year] DOA: 02/07/2020  PCP: Deland Pretty, MD  Admit date: 01/21/2020 Discharge date: 02-09-2020  Admitted From: Skilled nursing facility Disposition: Residential hospice  Discharge Condition:Critically ill CODE STATUS:  Comfort Care    Brief/Interim Summary:  HPI: Steven Kline is a 79 y.o. male with medical history significant of dementia, bladder cancer, DM2, HLD. Presenting with altered mental status. History from dtr. She reports that his health has been declining over the last several months. He has become weaker (now requiring WC for mobility). He is not eating. His mentation is worsening. It seems that each time family has checked on him in the last several weeks, he has been worse. This morning family was checking on him and they found him unresponsive. He was staring off into space and speaking garbles words. They became concerned and brought him to the ED.   ED Course: Family spoke with EDP about his case. He was found to have PNA. Family has decided that they want him to be comfort care measures.  Hospital course:  After discussion with family, he was started on full comfort care. He was started on IV morphine  as needed . During examination today, patient was found to be uncomfortable, slightly agitated. After discussion with the family at the bedside, we started on morphine drip. He can be transferred to Residential hospice whenever possible.  Discharge Diagnoses:  Active Problems:   AMS (altered mental status)    Discharge Instructions   Allergies as of 02/09/20      Reactions   Seroquel [quetiapine]    "made him mean"      Medication List    STOP taking these medications   bacitracin ointment   BENEFIBER PO   citalopram 20 MG tablet Commonly known as: CELEXA   divalproex 125 MG capsule Commonly known as: DEPAKOTE SPRINKLE   donepezil 5 MG tablet Commonly known as: ARICEPT    ENSURE NUTRITION SHAKE PO   esomeprazole 20 MG capsule Commonly known as: NEXIUM   loratadine 10 MG tablet Commonly known as: CLARITIN   LORazepam 0.5 MG tablet Commonly known as: ATIVAN   metFORMIN 1000 MG tablet Commonly known as: GLUCOPHAGE   mirtazapine 7.5 MG tablet Commonly known as: REMERON   ondansetron 4 MG tablet Commonly known as: ZOFRAN   pravastatin 80 MG tablet Commonly known as: PRAVACHOL   PROBIOTIC PO       Allergies  Allergen Reactions  . Seroquel [Quetiapine]     "made him mean"    Consultations:  None   Procedures/Studies: CT Head Wo Contrast  Result Date: 01/17/2020 CLINICAL DATA:  Mental status change. EXAM: CT HEAD WITHOUT CONTRAST TECHNIQUE: Contiguous axial images were obtained from the base of the skull through the vertex without intravenous contrast. COMPARISON:  11/02/2019 FINDINGS: Brain: Ventricles and cisterns are normal. There is prominence of the CSF spaces compatible with age related atrophy unchanged. There is moderate chronic ischemic microvascular disease left worse than right. Few small scattered small areas of old infarcts of the left hemisphere. No focal mass, mass effect or shift of midline structures. No evidence of acute hemorrhage. No evidence of acute infarction. Vascular: No hyperdense vessel or unexpected calcification. Skull: Normal. Negative for fracture or focal lesion. Sinuses/Orbits: No acute finding. Other: None. IMPRESSION: 1. No acute findings. 2. 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 Electronically Signed   By: Marin Olp M.D.  On: 02/09/2020 14:36   DG Chest Port 1 View  Result Date: 02/07/2020 CLINICAL DATA:  Hypoxia EXAM: PORTABLE CHEST 1 VIEW COMPARISON:  08/24/2011 FINDINGS: The heart size and mediastinal contours are within normal limits. Atherosclerotic calcification of the aortic knob. Bilateral perihilar and bibasilar  interstitial opacities, right worse than left. No pleural effusion. No pneumothorax. The visualized skeletal structures are unremarkable. IMPRESSION: Bilateral perihilar and bibasilar interstitial opacities, right worse than left. Findings may represent edema versus atypical/viral infection. Electronically Signed   By: Davina Poke D.O.   On: 01/25/2020 14:24       Subjective:   Discharge Exam: Vitals:   01/29/2020 1921 02/04/2020 0407  BP: (!) 78/59 (!) 90/53  Pulse: 96 (!) 112  Resp: (!) 24 (!) 28  Temp: 98.7 F (37.1 C) 98.8 F (37.1 C)  SpO2: 94% 92%   Vitals:   02/08/2020 1702 01/30/2020 1812 01/20/2020 1921 02/04/2020 0407  BP:  (!) 86/63 (!) 78/59 (!) 90/53  Pulse:  (!) 101 96 (!) 112  Resp:  (!) 28 (!) 24 (!) 28  Temp: 98.3 F (36.8 C) 98.4 F (36.9 C) 98.7 F (37.1 C) 98.8 F (37.1 C)  TempSrc: Rectal Oral Axillary Oral  SpO2:  (!) 87% 94% 92%  Weight:  57.4 kg    Height:  6\' 1"  (1.854 m)      General: Pt is not alert,but  awake, mildly agitated , critically ill    The results of significant diagnostics from this hospitalization (including imaging, microbiology, ancillary and laboratory) are listed below for reference.     Microbiology: Recent Results (from the past 240 hour(s))  Resp Panel by RT-PCR (Flu A&B, Covid) Nasopharyngeal Swab     Status: None   Collection Time: 01/13/2020  4:58 PM   Specimen: Nasopharyngeal Swab; Nasopharyngeal(NP) swabs in vial transport medium  Result Value Ref Range Status   SARS Coronavirus 2 by RT PCR NEGATIVE NEGATIVE Final    Comment: (NOTE) SARS-CoV-2 target nucleic acids are NOT DETECTED.  The SARS-CoV-2 RNA is generally detectable in upper respiratory specimens during the acute phase of infection. The lowest concentration of SARS-CoV-2 viral copies this assay can detect is 138 copies/mL. A negative result does not preclude SARS-Cov-2 infection and should not be used as the sole basis for treatment or other patient  management decisions. A negative result may occur with  improper specimen collection/handling, submission of specimen other than nasopharyngeal swab, presence of viral mutation(s) within the areas targeted by this assay, and inadequate number of viral copies(<138 copies/mL). A negative result must be combined with clinical observations, patient history, and epidemiological information. The expected result is Negative.  Fact Sheet for Patients:  EntrepreneurPulse.com.au  Fact Sheet for Healthcare Providers:  IncredibleEmployment.be  This test is no t yet approved or cleared by the [place] FDA and  has been authorized for detection and/or diagnosis of SARS-CoV-2 by FDA under an Emergency Use Authorization (EUA). This EUA will remain  in effect (meaning this test can be used) for the duration of the COVID-19 declaration under Section 564(b)(1) of the Act, 21 U.S.C.section 360bbb-3(b)(1), unless the authorization is terminated  or revoked sooner.       Influenza A by PCR NEGATIVE NEGATIVE Final   Influenza B by PCR NEGATIVE NEGATIVE Final    Comment: (NOTE) The Xpert Xpress SARS-CoV-2/FLU/RSV plus assay is intended as an aid in the diagnosis of influenza from Nasopharyngeal swab specimens and should not be used as a sole basis for treatment.  Nasal washings and aspirates are unacceptable for Xpert Xpress SARS-CoV-2/FLU/RSV testing.  Fact Sheet for Patients: EntrepreneurPulse.com.au  Fact Sheet for Healthcare Providers: IncredibleEmployment.be  This test is not yet approved or cleared by the [place] FDA and has been authorized for detection and/or diagnosis of SARS-CoV-2 by FDA under an Emergency Use Authorization (EUA). This EUA will remain in effect (meaning this test can be used) for the duration of the COVID-19 declaration under Section 564(b)(1) of the Act, 21 U.S.C. section 360bbb-3(b)(1),  unless the authorization is terminated or revoked.  Performed at [facility], [place] [address], [place], [place] [address]      Labs: BNP (last 3 results) No results for input(s): BNP in the last 8760 hours. Basic Metabolic Panel: Recent Labs  Lab 02/06/2020 1310  NA 139  K 4.4  CL 102  CO2 17*  GLUCOSE 428*  BUN 81*  CREATININE 3.46*  CALCIUM 8.2*   Liver Function Tests: Recent Labs  Lab 01/26/2020 1310  AST 40  ALT 20  ALKPHOS 113  BILITOT 0.9  PROT 5.9*  ALBUMIN 2.4*   No results for input(s): LIPASE, AMYLASE in the last 168 hours. No results for input(s): AMMONIA in the last 168 hours. CBC: Recent Labs  Lab 01/18/2020 1310  WBC 18.2*  NEUTROABS 17.0*  HGB 10.9*  HCT 36.5*  MCV 93.4  PLT 321   Cardiac Enzymes: No results for input(s): CKTOTAL, CKMB, CKMBINDEX, TROPONINI in the last 168 hours. BNP: Invalid input(s): POCBNP CBG: Recent Labs  Lab 02/04/2020 1253  GLUCAP 369*   D-Dimer No results for input(s): DDIMER in the last 72 hours. Hgb A1c No results for input(s): HGBA1C in the last 72 hours. Lipid Profile No results for input(s): CHOL, HDL, LDLCALC, TRIG, CHOLHDL, LDLDIRECT in the last 72 hours. Thyroid function studies Recent Labs    02/08/2020 1310  TSH 5.087*   Anemia work up No results for input(s): VITAMINB12, FOLATE, FERRITIN, TIBC, IRON, RETICCTPCT in the last 72 hours. Urinalysis No results found for: COLORURINE, APPEARANCEUR, LABSPEC, [place], GLUCOSEU, [place], [place], KETONESUR, PROTEINUR, UROBILINOGEN, NITRITE, LEUKOCYTESUR Sepsis Labs Invalid input(s): PROCALCITONIN,  WBC,  LACTICIDVEN Microbiology Recent Results (from the past 240 hour(s))  Resp Panel by RT-PCR (Flu A&B, Covid) Nasopharyngeal Swab     Status: None   Collection Time: 02/01/20  4:58 PM   Specimen: Nasopharyngeal Swab; Nasopharyngeal(NP) swabs in vial transport medium  Result Value Ref Range Status   SARS Coronavirus 2 by RT PCR NEGATIVE  NEGATIVE Final    Comment: (NOTE) SARS-CoV-2 target nucleic acids are NOT DETECTED.  The SARS-CoV-2 RNA is generally detectable in upper respiratory specimens during the acute phase of infection. The lowest concentration of SARS-CoV-2 viral copies this assay can detect is 138 copies/mL. A negative result does not preclude SARS-Cov-2 infection and should not be used as the sole basis for treatment or other patient management decisions. A negative result may occur with  improper specimen collection/handling, submission of specimen other than nasopharyngeal swab, presence of viral mutation(s) within the areas targeted by this assay, and inadequate number of viral copies(<138 copies/mL). A negative result must be combined with clinical observations, patient history, and epidemiological information. The expected result is Negative.  Fact Sheet for Patients:  EntrepreneurPulse.com.au  Fact Sheet for Healthcare Providers:  IncredibleEmployment.be  This test is no t yet approved or cleared by the [place] FDA and  has been authorized for detection and/or diagnosis of SARS-CoV-2 by FDA under an Emergency Use Authorization (EUA).  This EUA will remain  in effect (meaning this test can be used) for the duration of the COVID-19 declaration under Section 564(b)(1) of the Act, 21 U.S.C.section 360bbb-3(b)(1), unless the authorization is terminated  or revoked sooner.       Influenza A by PCR NEGATIVE NEGATIVE Final   Influenza B by PCR NEGATIVE NEGATIVE Final    Comment: (NOTE) The Xpert Xpress SARS-CoV-2/FLU/RSV plus assay is intended as an aid in the diagnosis of influenza from Nasopharyngeal swab specimens and should not be used as a sole basis for treatment. Nasal washings and aspirates are unacceptable for Xpert Xpress SARS-CoV-2/FLU/RSV testing.  Fact Sheet for Patients: EntrepreneurPulse.com.au  Fact Sheet for Healthcare  Providers: IncredibleEmployment.be  This test is not yet approved or cleared by the [place] FDA and has been authorized for detection and/or diagnosis of SARS-CoV-2 by FDA under an Emergency Use Authorization (EUA). This EUA will remain in effect (meaning this test can be used) for the duration of the COVID-19 declaration under Section 564(b)(1) of the Act, 21 U.S.C. section 360bbb-3(b)(1), unless the authorization is terminated or revoked.  Performed at [facility], [place] [address], [place], [place] [address]     Please note: You were cared for by a hospitalist during your hospital stay. Once you are discharged, your primary care physician will handle any further medical issues. Please note that NO REFILLS for any discharge medications will be authorized once you are discharged, as it is imperative that you return to your primary care physician (or establish a relationship with a primary care physician if you do not have one) for your post hospital discharge needs so that they can reassess your need for medications and monitor your lab values.    Time coordinating discharge: 40 minutes  SIGNED:   Shelly Coss, MD  [facility] 16-Feb-2020, 10:23 AM Pager [phone]  If 7PM-7AM, please contact night-coverage [email or web address] Password [email or web address]

## 2020-02-11 NOTE — Progress Notes (Signed)
Manufacturing engineer [facility]) Hospital Liaison note.    Manufacturing engineer [facility]) Hospital Liaison note.   Received request yesterday from [place] for family interest in [facility]. Chart reviewed and eligibility confirmed. Spoke with family to confirm interest and explain services. Bed offered for today; however, daughter [name] is concerned that patient is too uncomfortable to tolerate transfer.  Morphine drip ordered by Dr. Tawanna Solo, so plan made to reassess tomorrow.  TOC aware.    Thank you for the opportunity to participate in this patient's care.  Chrislyn Edison Pace, BSN, RN [facility] Liaison (listed on AMION under Hosp[facility])    [phone]  (24h on call)

## 2020-02-11 DEATH — deceased

## 2022-07-23 IMAGING — CT CT HEAD W/O CM
3 series · 15 of 47 positions shown, 18 images · non-contrast
Comparison: CT head 09/18/2019

CLINICAL DATA: Fall, no loss of consciousness

EXAM:
CT HEAD WITHOUT CONTRAST
TECHNIQUE: Contiguous axial images were obtained from the base of the skull
through the vertex without intravenous contrast.

[Series 2: head wo · axial · 0.47mm/px · z∈[+1244,+1374]mm · 9 of 32 slices shown, 12 images]
[im 3/32  brain]
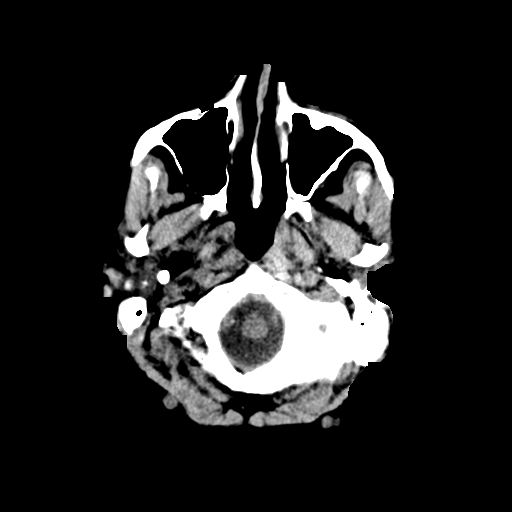
[im 3/32  bone]
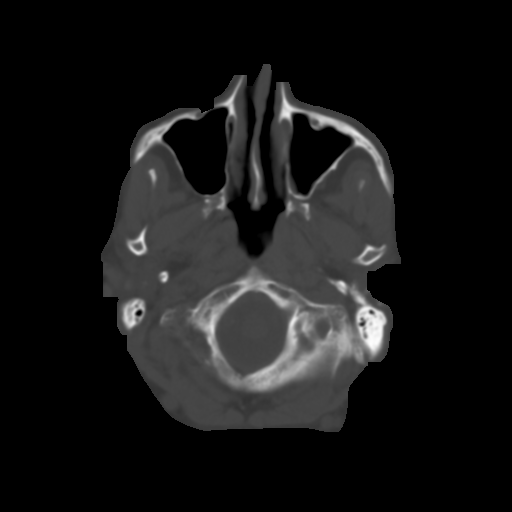
[im 6/32  brain]
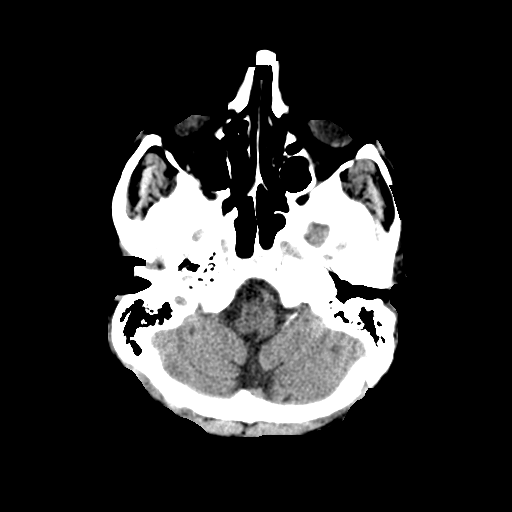
[im 9/32  brain]
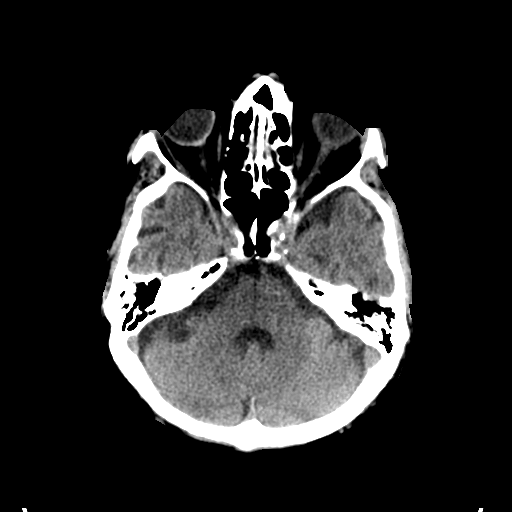
[im 12/32  brain]
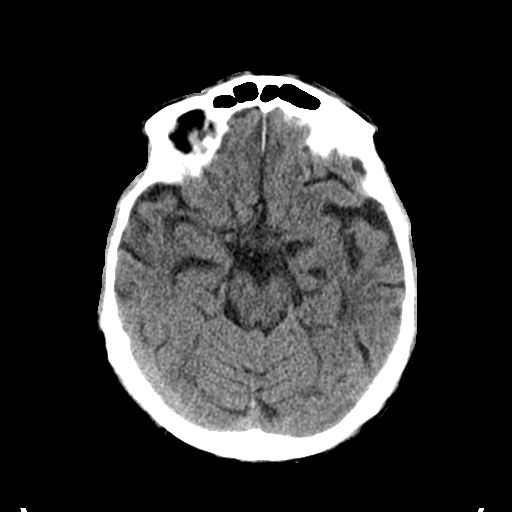
[im 17/32  brain]
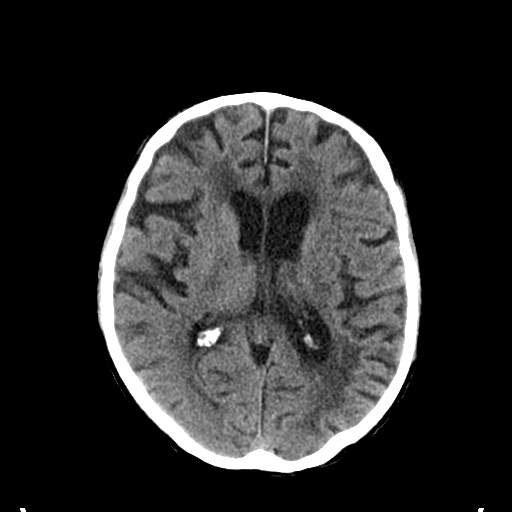
[im 17/32  bone]
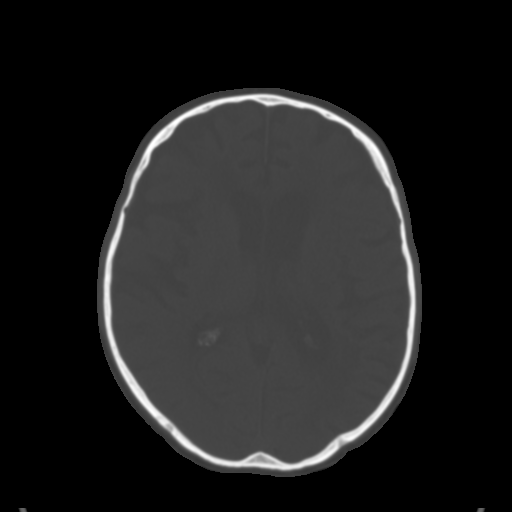
[im 20/32  brain]
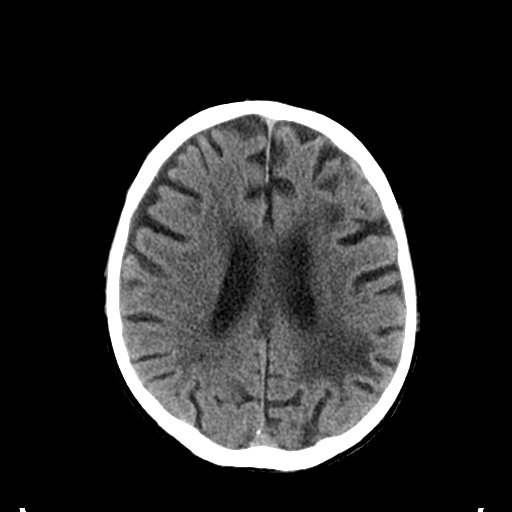
[im 23/32  brain]
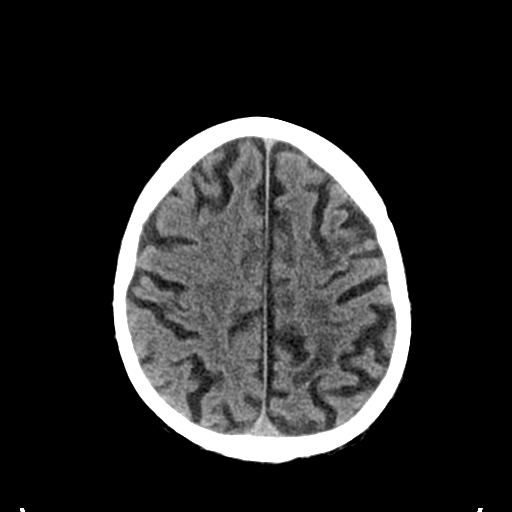
[im 26/32  brain]
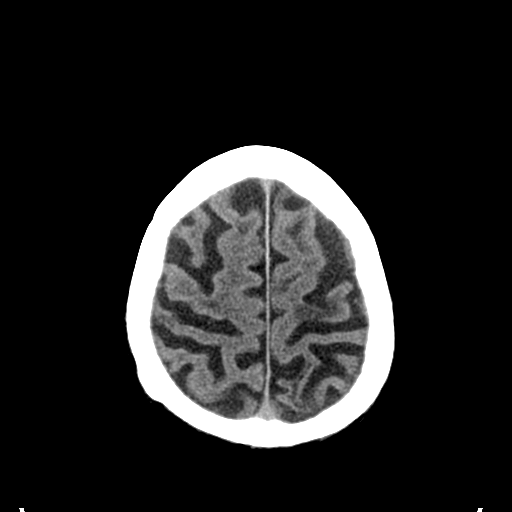
[im 29/32  brain]
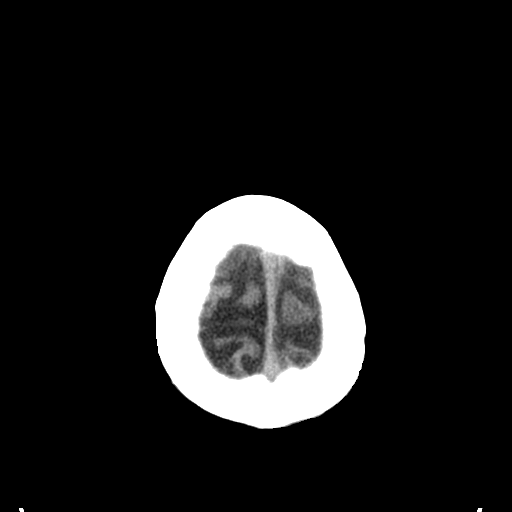
[im 29/32  bone]
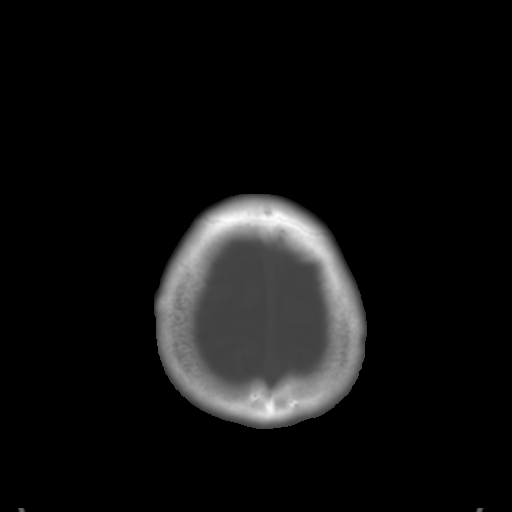

[Series 4: coronal soft tissue · coronal · 0.31mm/px · 3 of 63 slices shown]
[im 21/63  brain]
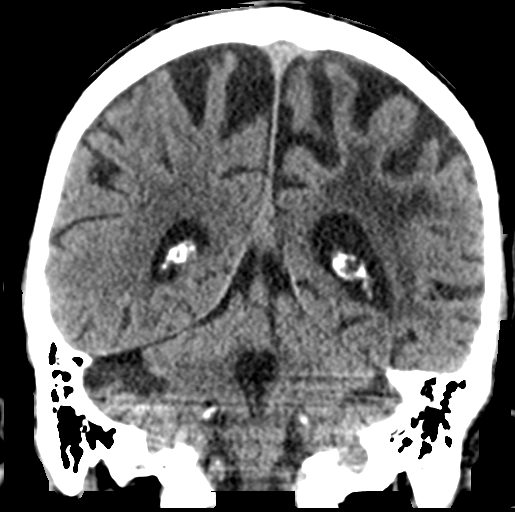
[im 28/63  brain]
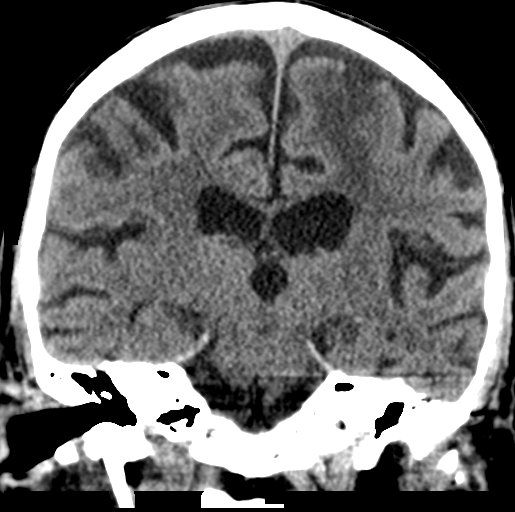
[im 35/63  brain]
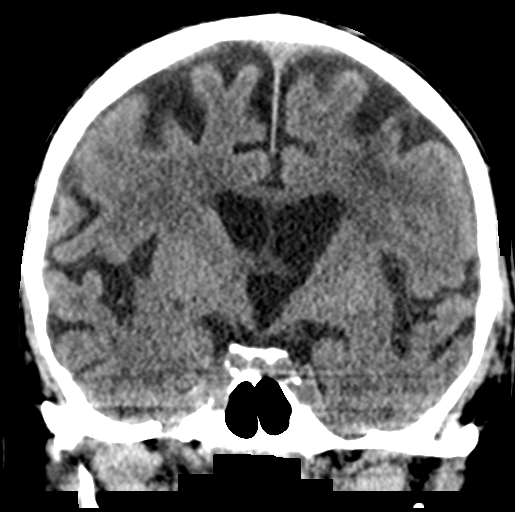

[Series 5: sagittal soft tissue · sagittal · 0.31mm/px · 3 of 51 slices shown]
[im 17/51  brain]
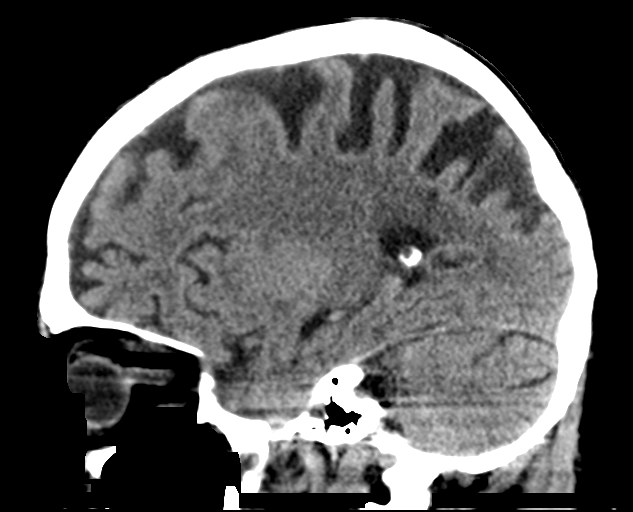
[im 26/51  brain]
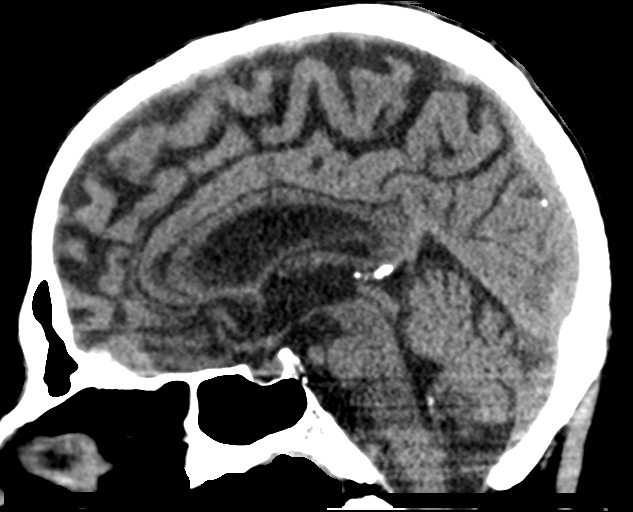
[im 34/51  brain]
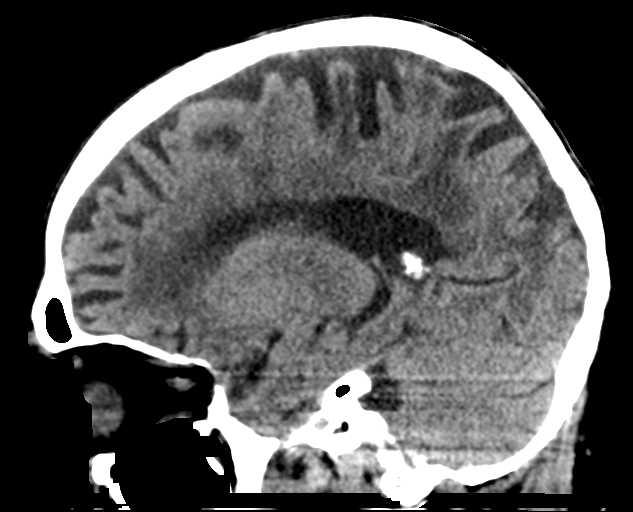

[15 of 47 positions shown; findings below may reference images not displayed]

FINDINGS: Brain: Areas of remote cortically based infarct are again seen in
the left frontal and parietal lobes and right cerebellum. Additional
lacunar type infarcts seen in the bilateral corona radiata and basal
ganglia. No evidence of acute infarction, hemorrhage, hydrocephalus,
extra-axial collection or mass lesion/mass effect. Symmetric
prominence of the ventricles, cisterns and sulci compatible with
extensive parenchymal volume loss. Patchy and confluent areas of
white matter hypoattenuation are most compatible with severe chronic
microvascular angiopathy.

Vascular: Atherosclerotic calcification of the carotid siphons. No
hyperdense vessel.

Skull: No calvarial fracture or suspicious osseous lesion. No scalp
swelling or hematoma.

Sinuses/Orbits: Minimal mural thickening in the left maxillary sinus
and ethmoid air cells. Remaining paranasal sinuses and mastoid air
cells are predominantly clear.

Other: None.
IMPRESSION: 1. No acute intracranial abnormality. No scalp swelling or calvarial
fracture.
2. Remote cortically based infarcts in the left frontal and parietal
lobes and right cerebellum. Additional lacunar type infarcts in the
bilateral corona radiata and basal ganglia.
3. Severe chronic microvascular angiopathy and parenchymal volume
loss.
